# Patient Record
Sex: Male | Born: 1950 | Race: White | Hispanic: No | Marital: Married | Smoking: Current every day smoker
Health system: Southern US, Community
[De-identification: ages and names within clinical notes are randomized; demographics above are authoritative.]

## PROBLEM LIST (undated history)

## (undated) DIAGNOSIS — F17200 Nicotine dependence, unspecified, uncomplicated: Secondary | ICD-10-CM

## (undated) DIAGNOSIS — J449 Chronic obstructive pulmonary disease, unspecified: Secondary | ICD-10-CM

## (undated) DIAGNOSIS — G7119 Other specified myotonic disorders: Secondary | ICD-10-CM

## (undated) DIAGNOSIS — F1020 Alcohol dependence, uncomplicated: Secondary | ICD-10-CM

## (undated) DIAGNOSIS — R296 Repeated falls: Secondary | ICD-10-CM

## (undated) DIAGNOSIS — K219 Gastro-esophageal reflux disease without esophagitis: Secondary | ICD-10-CM

## (undated) DIAGNOSIS — F419 Anxiety disorder, unspecified: Secondary | ICD-10-CM

## (undated) DIAGNOSIS — R569 Unspecified convulsions: Secondary | ICD-10-CM

## (undated) DIAGNOSIS — I10 Essential (primary) hypertension: Secondary | ICD-10-CM

## (undated) DIAGNOSIS — J189 Pneumonia, unspecified organism: Secondary | ICD-10-CM

## (undated) DIAGNOSIS — I739 Peripheral vascular disease, unspecified: Secondary | ICD-10-CM

## (undated) DIAGNOSIS — F1320 Sedative, hypnotic or anxiolytic dependence, uncomplicated: Secondary | ICD-10-CM

## (undated) HISTORY — PX: COLONOSCOPY: SHX174

## (undated) HISTORY — PX: ESOPHAGOGASTRODUODENOSCOPY ENDOSCOPY: SHX5814

## (undated) HISTORY — PX: FRACTURE SURGERY: SHX138

## (undated) HISTORY — PX: VASCULAR SURGERY: SHX849

## (undated) HISTORY — PX: TRACHEOSTOMY: SUR1362

---

## 1979-06-12 HISTORY — PX: FINGER FRACTURE SURGERY: SHX638

## 2012-08-07 ENCOUNTER — Encounter (HOSPITAL_COMMUNITY): Payer: Self-pay

## 2012-08-11 NOTE — Patient Instructions (Addendum)
[address]  08/11/2012   Your procedure is scheduled on:  08/21/12  Report to [facility] at 0800 AM.  Call this number if you have problems the morning of surgery: [phone]   Remember:   Do not eat food:After Midnight.  May have clear liquids:until Midnight .  Clear liquids include soda, tea, black coffee, apple or grape juice, broth.  Take these medicines the morning of surgery with A SIP OF WATER: toprol, dilantin, neurontin, ativan, klonopin   Do not wear jewelry, make-up or nail polish.  Do not wear lotions, powders, or perfumes. You may wear deodorant.  Do not shave 48 hours prior to surgery. Men may shave face and neck.  Do not bring valuables to the hospital.  Contacts, dentures or bridgework may not be worn into surgery.  Leave suitcase in the car. After surgery it may be brought to your room.  For patients admitted to the hospital, checkout time is 11:00 AM the day of discharge.   Patients discharged the day of surgery will not be allowed to drive home.  Name and phone number of your driver: family  Special Instructions: N/A   Please read over the following fact sheets that you were given: Anesthesia Post-op Instructions and Care and Recovery After Surgery   PATIENT INSTRUCTIONS POST-ANESTHESIA  IMMEDIATELY FOLLOWING SURGERY:  Do not drive or operate machinery for the first twenty four hours after surgery.  Do not make any important decisions for twenty four hours after surgery or while taking narcotic pain medications or sedatives.  If you develop intractable nausea and vomiting or a severe headache please notify your doctor immediately.  FOLLOW-UP:  Please make an appointment with your surgeon as instructed. You do not need to follow up with anesthesia unless specifically instructed to do so.  WOUND CARE INSTRUCTIONS (if applicable):  Keep a dry clean dressing on the anesthesia/puncture wound site if there is drainage.  Once the wound has quit draining you may  leave it open to air.  Generally you should leave the bandage intact for twenty four hours unless there is drainage.  If the epidural site drains for more than 36-48 hours please call the anesthesia department.  QUESTIONS?:  Please feel free to call your physician or the hospital operator if you have any questions, and they will be happy to assist you.      Cataract Surgery  A cataract is a clouding of the lens of the eye. When a lens becomes cloudy, vision is reduced based on the degree and nature of the clouding. Surgery may be needed to improve vision. Surgery removes the cloudy lens and usually replaces it with a substitute lens (intraocular lens, IOL). LET YOUR EYE DOCTOR KNOW ABOUT:  Allergies to food or medicine.  Medicines taken including herbs, eyedrops, over-the-counter medicines, and creams.  Use of steroids (by mouth or creams).  Previous problems with anesthetics or numbing medicine.  History of bleeding problems or blood clots.  Previous surgery.  Other health problems, including diabetes and kidney problems.  Possibility of pregnancy, if this applies. RISKS AND COMPLICATIONS  Infection.  Inflammation of the eyeball (endophthalmitis) that can spread to both eyes (sympathetic ophthalmia).  Poor wound healing.  If an IOL is inserted, it can later fall out of proper position. This is very uncommon.  Clouding of the part of your eye that holds an IOL in place. This is called an "after-cataract." These are uncommon, but easily treated. BEFORE THE PROCEDURE  Do not  eat or drink anything except small amounts of water for 8 to 12 before your surgery, or as directed by your caregiver.  Unless you are told otherwise, continue any eyedrops you have been prescribed.  Talk to your primary caregiver about all other medicines that you take (both prescription and non-prescription). In some cases, you may need to stop or change medicines near the time of your surgery. This is  most important if you are taking blood-thinning medicine.Do not stop medicines unless you are told to do so.  Arrange for someone to drive you to and from the procedure.  Do not put contact lenses in either eye on the day of your surgery. PROCEDURE There is more than one method for safely removing a cataract. Your doctor can explain the differences and help determine which is best for you. Phacoemulsification surgery is the most common form of cataract surgery.  An injection is given behind the eye or eyedrops are given to make this a painless procedure.  A small cut (incision) is made on the edge of the clear, dome-shaped surface that covers the front of the eye (cornea).  A tiny probe is painlessly inserted into the eye. This device gives off ultrasound waves that soften and break up the cloudy center of the lens. This makes it easier for the cloudy lens to be removed by suction.  An IOL may be implanted.  The normal lens of the eye is covered by a clear capsule. Part of that capsule is intentionally left in the eye to support the IOL.  Your surgeon may or may not use stitches to close the incision. There are other forms of cataract surgery that require a larger incision and stiches to close the eye. This approach is taken in cases where the doctor feels that the cataract cannot be easily removed using phacoemulsification. AFTER THE PROCEDURE  When an IOL is implanted, it does not need care. It becomes a permanent part of your eye and cannot be seen or felt.  Your doctor will schedule follow-up exams to check on your progress.  Review your other medicines with your doctor to see which can be resumed after surgery.  Use eyedrops or take medicine as prescribed by your doctor. Document Released: 09/16/2011 Document Revised: 12/20/2011 Document Reviewed: 09/16/2011 [facility] Patient Information 2013 [place], [place].

## 2012-08-14 ENCOUNTER — Encounter (HOSPITAL_COMMUNITY)
Admission: RE | Admit: 2012-08-14 | Discharge: 2012-08-14 | Disposition: A | Discharge status: Home / Self Care | Payer: Medicare Other | Source: Ambulatory Visit | Attending: Ophthalmology | Admitting: Ophthalmology

## 2012-08-14 ENCOUNTER — Encounter (HOSPITAL_COMMUNITY): Payer: Self-pay

## 2012-08-14 HISTORY — DX: Essential (primary) hypertension: I10

## 2012-08-14 HISTORY — DX: Peripheral vascular disease, unspecified: I73.9

## 2012-08-14 HISTORY — DX: Other specified myotonic disorders: G71.19

## 2012-08-14 HISTORY — DX: Unspecified convulsions: R56.9

## 2012-08-14 HISTORY — DX: Chronic obstructive pulmonary disease, unspecified: J44.9

## 2012-08-14 HISTORY — DX: Gastro-esophageal reflux disease without esophagitis: K21.9

## 2012-08-14 LAB — BASIC METABOLIC PANEL
BUN: 12 mg/dL (ref 6–23)
Chloride: 94 mEq/L — ABNORMAL LOW (ref 96–112)
Creatinine, Ser: 0.66 mg/dL (ref 0.50–1.35)
GFR calc Af Amer: >90 mL/min (ref >90)
GFR calc non Af Amer: >90 mL/min (ref >90)

## 2012-08-14 LAB — HEMOGLOBIN AND HEMATOCRIT, BLOOD: Hemoglobin: 18.5 g/dL — ABNORMAL HIGH (ref 13.0–17.0)

## 2012-08-21 ENCOUNTER — Encounter (HOSPITAL_COMMUNITY)
Admission: RE | Disposition: A | Discharge status: Home / Self Care | Payer: Self-pay | Source: Ambulatory Visit | Attending: Ophthalmology

## 2012-08-21 ENCOUNTER — Ambulatory Visit (HOSPITAL_COMMUNITY)
Admission: RE | Admit: 2012-08-21 | Discharge: 2012-08-21 | Disposition: A | Discharge status: Home / Self Care | Payer: Medicare Other | Source: Ambulatory Visit | Attending: Ophthalmology | Admitting: Ophthalmology

## 2012-08-21 ENCOUNTER — Encounter (HOSPITAL_COMMUNITY): Payer: Self-pay | Admitting: *Deleted

## 2012-08-21 ENCOUNTER — Encounter (HOSPITAL_COMMUNITY): Payer: Self-pay | Admitting: Anesthesiology

## 2012-08-21 ENCOUNTER — Encounter (HOSPITAL_COMMUNITY): Payer: Self-pay | Admitting: Ophthalmology

## 2012-08-21 ENCOUNTER — Ambulatory Visit (HOSPITAL_COMMUNITY): Payer: Medicare Other | Admitting: Anesthesiology

## 2012-08-21 DIAGNOSIS — J449 Chronic obstructive pulmonary disease, unspecified: Secondary | ICD-10-CM | POA: Insufficient documentation

## 2012-08-21 DIAGNOSIS — J4489 Other specified chronic obstructive pulmonary disease: Secondary | ICD-10-CM | POA: Insufficient documentation

## 2012-08-21 DIAGNOSIS — Z01810 Encounter for preprocedural cardiovascular examination: Secondary | ICD-10-CM | POA: Insufficient documentation

## 2012-08-21 DIAGNOSIS — I10 Essential (primary) hypertension: Secondary | ICD-10-CM | POA: Insufficient documentation

## 2012-08-21 DIAGNOSIS — H2510 Age-related nuclear cataract, unspecified eye: Secondary | ICD-10-CM | POA: Insufficient documentation

## 2012-08-21 DIAGNOSIS — Z01812 Encounter for preprocedural laboratory examination: Secondary | ICD-10-CM | POA: Insufficient documentation

## 2012-08-21 HISTORY — PX: CATARACT EXTRACTION W/PHACO: SHX586

## 2012-08-21 SURGERY — PHACOEMULSIFICATION, CATARACT, WITH IOL INSERTION
Anesthesia: Monitor Anesthesia Care | Site: Eye | Laterality: Right | Wound class: Clean

## 2012-08-21 MED ORDER — EPINEPHRINE HCL 1 MG/ML IJ SOLN
INTRAOCULAR | Status: DC | PRN
  Administered 2012-08-21: 10:00:00

## 2012-08-21 MED ORDER — PHENYLEPHRINE HCL 2.5 % OP SOLN
1.0000 [drp] | OPHTHALMIC | Status: AC
  Administered 2012-08-21 (×3): 1 [drp] via OPHTHALMIC

## 2012-08-21 MED ORDER — LIDOCAINE HCL (PF) 1 % IJ SOLN
INTRAOCULAR | Status: DC | PRN
  Administered 2012-08-21: 10:00:00 via OPHTHALMIC

## 2012-08-21 MED ORDER — LIDOCAINE HCL 3.5 % OP GEL
1.0000 "application " | Freq: Once | OPHTHALMIC | Status: AC
  Administered 2012-08-21: 1 via OPHTHALMIC

## 2012-08-21 MED ORDER — LIDOCAINE 3.5 % OP GEL OPTIME - NO CHARGE
OPHTHALMIC | Status: DC | PRN
  Administered 2012-08-21: 2 [drp] via OPHTHALMIC

## 2012-08-21 MED ORDER — PROVISC 10 MG/ML IO SOLN
INTRAOCULAR | Status: DC | PRN
  Administered 2012-08-21: 8.5 mg via INTRAOCULAR

## 2012-08-21 MED ORDER — NEOMYCIN-POLYMYXIN-DEXAMETH 0.1 % OP OINT
TOPICAL_OINTMENT | OPHTHALMIC | Status: DC | PRN
  Administered 2012-08-21: 1 via OPHTHALMIC

## 2012-08-21 MED ORDER — TETRACAINE HCL 0.5 % OP SOLN
1.0000 [drp] | OPHTHALMIC | Status: AC
  Administered 2012-08-21 (×3): 1 [drp] via OPHTHALMIC

## 2012-08-21 MED ORDER — TETRACAINE HCL 0.5 % OP SOLN
OPHTHALMIC | Status: AC
  Filled 2012-08-21: qty 2

## 2012-08-21 MED ORDER — CYCLOPENTOLATE-PHENYLEPHRINE 0.2-1 % OP SOLN
1.0000 [drp] | OPHTHALMIC | Status: AC
Start: 2012-08-21 — End: 2012-08-21
  Administered 2012-08-21 (×3): 1 [drp] via OPHTHALMIC

## 2012-08-21 MED ORDER — EPINEPHRINE HCL 1 MG/ML IJ SOLN
INTRAMUSCULAR | Status: AC
  Filled 2012-08-21: qty 1

## 2012-08-21 MED ORDER — BSS IO SOLN
INTRAOCULAR | Status: DC | PRN
  Administered 2012-08-21: 15 mL via INTRAOCULAR

## 2012-08-21 MED ORDER — LACTATED RINGERS IV SOLN
INTRAVENOUS | Status: DC
  Administered 2012-08-21: 1000 mL via INTRAVENOUS

## 2012-08-21 MED ORDER — CYCLOPENTOLATE-PHENYLEPHRINE 0.2-1 % OP SOLN
OPHTHALMIC | Status: AC
  Filled 2012-08-21: qty 2

## 2012-08-21 MED ORDER — PHENYLEPHRINE HCL 2.5 % OP SOLN
OPHTHALMIC | Status: AC
  Filled 2012-08-21: qty 2

## 2012-08-21 MED ORDER — MIDAZOLAM HCL 2 MG/2ML IJ SOLN
1.0000 mg | INTRAMUSCULAR | Status: DC | PRN
  Administered 2012-08-21 (×2): 1 mg via INTRAVENOUS

## 2012-08-21 MED ORDER — MIDAZOLAM HCL 2 MG/2ML IJ SOLN
INTRAMUSCULAR | Status: AC
  Filled 2012-08-21: qty 2

## 2012-08-21 MED ORDER — LIDOCAINE HCL 3.5 % OP GEL
OPHTHALMIC | Status: AC
  Filled 2012-08-21: qty 5

## 2012-08-21 MED ORDER — NEOMYCIN-POLYMYXIN-DEXAMETH 3.5-10000-0.1 OP OINT
TOPICAL_OINTMENT | OPHTHALMIC | Status: AC
  Filled 2012-08-21: qty 3.5

## 2012-08-21 MED ORDER — POVIDONE-IODINE 5 % OP SOLN
OPHTHALMIC | Status: DC | PRN
  Administered 2012-08-21: 1 via OPHTHALMIC

## 2012-08-21 MED ORDER — LIDOCAINE HCL (PF) 1 % IJ SOLN
INTRAMUSCULAR | Status: AC
  Filled 2012-08-21: qty 2

## 2012-08-21 SURGICAL SUPPLY — 32 items

## 2012-08-21 NOTE — Anesthesia Postprocedure Evaluation (Signed)
  Anesthesia Post-op Note  Patient: Kenneth Bass  Procedure(s) Performed: Procedure(s) (LRB) with comments: CATARACT EXTRACTION PHACO AND INTRAOCULAR LENS PLACEMENT (IOC) (Right) - CDE=12.47  Patient Location: PACU and Short Stay  Anesthesia Type:MAC  Level of Consciousness: awake, alert  and oriented  Airway and Oxygen Therapy: Patient Spontanous Breathing  Post-op Pain: none  Post-op Assessment: Post-op Vital signs reviewed, Patient's Cardiovascular Status Stable, Respiratory Function Stable, Patent Airway and No signs of Nausea or vomiting  Post-op Vital Signs: Reviewed  Complications: No apparent anesthesia complications

## 2012-08-21 NOTE — Op Note (Signed)
NAME:  Kenneth Bass, Kenneth Bass NO.:  [id number]  MEDICAL RECORD NO.:  [id number]  LOCATION:  [facility]                          FACILITY:  [facility]  PHYSICIAN:  Susanne Greenhouse, MD       DATE OF BIRTH:  1950/11/27  DATE OF PROCEDURE:  08/21/2012 DATE OF DISCHARGE:  08/21/2012                              OPERATIVE REPORT   PREOPERATIVE DIAGNOSIS:  Nuclear cataract, right eye, diagnosis code 366.16.  POSTOPERATIVE DIAGNOSIS:  Nuclear cataract, right eye, diagnosis code 366.16.  SURGEON:  Susanne Greenhouse, MD  OPERATION PERFORMED:  Phacoemulsification with posterior chamber intraocular lens implantation, right eye.  ANESTHESIA:  Topical with monitored anesthesia care and IV sedation.  OPERATIVE SUMMARY:  In the preoperative area, dilating drops were placed into the right eye.  The patient was then brought into the operating room where he was placed under topical anesthesia and IV sedation.  The eye was then prepped and draped.  Beginning with a 75 blade, a paracentesis port was made at the surgeon's 2 o'clock position.  The anterior chamber was then filled with a 1% nonpreserved lidocaine solution with epinephrine.  This was followed by Viscoat to deepen the chamber.  A small fornix-based peritomy was performed superiorly.  Next, a single iris hook was placed through the limbus superiorly.  A 2.4-mm keratome blade was then used to make a clear corneal incision over the iris hook.  A bent cystotome needle and Utrata forceps were used to create a continuous tear capsulotomy.  Hydrodissection was performed using balanced salt solution on a fine cannula.  The lens nucleus was then removed using phacoemulsification in a quadrant cracking technique. The cortical material was then removed with irrigation and aspiration. The capsular bag and anterior chamber were refilled with Provisc.  The wound was widened to approximately 3 mm and a posterior chamber intraocular lens was placed into the  capsular bag without difficulty using an [employer] lens injecting system.  A single 10-0 nylon suture was then used to close the incision as well as stromal hydration. The Provisc was removed from the anterior chamber and capsular bag with irrigation and aspiration.  At this point, the wounds were tested for leak, which were negative.  The anterior chamber remained deep and stable.  The patient tolerated the procedure well.  There were no operative complications, and he awoke from topical anesthesia and IV sedation without problem.  No surgical specimens.  Prosthetic device used is a [employer] Lomb posterior chamber lens, model enVista, model number MX60, power of 15.5, serial number is [id number].          ______________________________ Susanne Greenhouse, MD     KEH/MEDQ  D:  08/21/2012  T:  08/21/2012  Job:  [id number]

## 2012-08-21 NOTE — H&P (Signed)
I have reviewed the H&P, the patient was re-examined, and I have identified no interval changes in medical condition and plan of care since the history and physical of record  

## 2012-08-21 NOTE — Transfer of Care (Signed)
Immediate Anesthesia Transfer of Care Note  Patient: Kenneth Bass  Procedure(s) Performed: Procedure(s) (LRB) with comments: CATARACT EXTRACTION PHACO AND INTRAOCULAR LENS PLACEMENT (IOC) (Right) - CDE=12.47  Patient Location: PACU and Short Stay  Anesthesia Type:MAC  Level of Consciousness: awake  Airway & Oxygen Therapy: Patient Spontanous Breathing  Post-op Assessment: Report given to PACU RN  Post vital signs: Reviewed  Complications: No apparent anesthesia complications

## 2012-08-21 NOTE — Anesthesia Preprocedure Evaluation (Addendum)
Anesthesia Evaluation  Patient identified by MRN, date of birth, ID band Patient awake    Reviewed: Allergy & Precautions, H&P , NPO status , Patient's Chart, lab work & pertinent test results, reviewed documented beta blocker date and time   Airway Mallampati: II  Neck ROM: Full    Dental  (+) Poor Dentition, Missing, Chipped, Loose and Dental Advisory Given   Pulmonary COPDCurrent Smoker,  + rhonchi         Cardiovascular hypertension, Pt. on medications Rhythm:Regular Rate:Normal     Neuro/Psych Seizures -, Well Controlled,  Anxiety  Neuromuscular disease    GI/Hepatic GERD-  ,(+)     substance abuse  alcohol use,   Endo/Other    Renal/GU      Musculoskeletal   Abdominal   Peds  Hematology   Anesthesia Other Findings   Reproductive/Obstetrics                           Anesthesia Physical Anesthesia Plan  ASA: III  Anesthesia Plan: MAC   Post-op Pain Management:    Induction: Intravenous  Airway Management Planned: Nasal Cannula  Additional Equipment:   Intra-op Plan:   Post-operative Plan:   Informed Consent: I have reviewed the patients History and Physical, chart, labs and discussed the procedure including the risks, benefits and alternatives for the proposed anesthesia with the patient or authorized representative who has indicated his/her understanding and acceptance.     Plan Discussed with:   Anesthesia Plan Comments:         Anesthesia Quick Evaluation  

## 2012-08-21 NOTE — Brief Op Note (Signed)
Pre-Op Dx: Cataract OD Post-Op Dx: Cataract OD Surgeon: Dayanira Giovannetti Anesthesia: Topical with MAC Surgery: Cataract Extraction with Intraocular lens Implant OD Implant: B&L enVista Specimen: None Complications: None 

## 2012-08-23 ENCOUNTER — Encounter (HOSPITAL_COMMUNITY): Payer: Self-pay | Admitting: Ophthalmology

## 2012-09-12 ENCOUNTER — Encounter (HOSPITAL_COMMUNITY): Payer: Self-pay | Admitting: Pharmacy Technician

## 2012-09-13 MED ORDER — FENTANYL CITRATE 0.05 MG/ML IJ SOLN: 25.0000 ug | INTRAMUSCULAR | Status: DC | PRN

## 2012-09-13 MED ORDER — ONDANSETRON HCL 4 MG/2ML IJ SOLN: 4.0000 mg | Freq: Once | INTRAMUSCULAR | Status: AC | PRN

## 2012-09-14 ENCOUNTER — Encounter (HOSPITAL_COMMUNITY): Payer: Self-pay

## 2012-09-14 ENCOUNTER — Encounter (HOSPITAL_COMMUNITY)
Admission: RE | Admit: 2012-09-14 | Discharge: 2012-09-14 | Payer: Medicare Other | Source: Ambulatory Visit | Admitting: Ophthalmology

## 2012-09-15 MED ORDER — LIDOCAINE HCL (PF) 1 % IJ SOLN
INTRAMUSCULAR | Status: AC
  Filled 2012-09-15: qty 2

## 2012-09-15 MED ORDER — CYCLOPENTOLATE-PHENYLEPHRINE 0.2-1 % OP SOLN
OPHTHALMIC | Status: AC
  Filled 2012-09-15: qty 2

## 2012-09-15 MED ORDER — TETRACAINE HCL 0.5 % OP SOLN
OPHTHALMIC | Status: AC
  Filled 2012-09-15: qty 2

## 2012-09-15 MED ORDER — NEOMYCIN-POLYMYXIN-DEXAMETH 3.5-10000-0.1 OP OINT
TOPICAL_OINTMENT | OPHTHALMIC | Status: AC
  Filled 2012-09-15: qty 3.5

## 2012-09-15 MED ORDER — PHENYLEPHRINE HCL 2.5 % OP SOLN
OPHTHALMIC | Status: AC
  Filled 2012-09-15: qty 2

## 2012-09-15 MED ORDER — LIDOCAINE HCL 3.5 % OP GEL
OPHTHALMIC | Status: AC
  Filled 2012-09-15: qty 5

## 2012-09-18 ENCOUNTER — Encounter (HOSPITAL_COMMUNITY)
Admission: RE | Disposition: A | Discharge status: Home / Self Care | Payer: Self-pay | Source: Ambulatory Visit | Attending: Ophthalmology

## 2012-09-18 ENCOUNTER — Ambulatory Visit (HOSPITAL_COMMUNITY)
Admission: RE | Admit: 2012-09-18 | Discharge: 2012-09-18 | Disposition: A | Discharge status: Home / Self Care | Payer: Medicare Other | Source: Ambulatory Visit | Attending: Ophthalmology | Admitting: Ophthalmology

## 2012-09-18 ENCOUNTER — Ambulatory Visit (HOSPITAL_COMMUNITY): Payer: Medicare Other | Admitting: Anesthesiology

## 2012-09-18 ENCOUNTER — Encounter (HOSPITAL_COMMUNITY): Payer: Self-pay | Admitting: Anesthesiology

## 2012-09-18 ENCOUNTER — Encounter (HOSPITAL_COMMUNITY): Payer: Self-pay | Admitting: *Deleted

## 2012-09-18 DIAGNOSIS — J449 Chronic obstructive pulmonary disease, unspecified: Secondary | ICD-10-CM | POA: Insufficient documentation

## 2012-09-18 DIAGNOSIS — J4489 Other specified chronic obstructive pulmonary disease: Secondary | ICD-10-CM | POA: Insufficient documentation

## 2012-09-18 DIAGNOSIS — H2510 Age-related nuclear cataract, unspecified eye: Secondary | ICD-10-CM | POA: Insufficient documentation

## 2012-09-18 DIAGNOSIS — I10 Essential (primary) hypertension: Secondary | ICD-10-CM | POA: Insufficient documentation

## 2012-09-18 DIAGNOSIS — F17200 Nicotine dependence, unspecified, uncomplicated: Secondary | ICD-10-CM | POA: Insufficient documentation

## 2012-09-18 HISTORY — PX: CATARACT EXTRACTION W/PHACO: SHX586

## 2012-09-18 SURGERY — PHACOEMULSIFICATION, CATARACT, WITH IOL INSERTION
Anesthesia: Monitor Anesthesia Care | Site: Eye | Laterality: Left | Wound class: Clean

## 2012-09-18 MED ORDER — LIDOCAINE 3.5 % OP GEL OPTIME - NO CHARGE
OPHTHALMIC | Status: DC | PRN
  Administered 2012-09-18: 2 [drp] via OPHTHALMIC

## 2012-09-18 MED ORDER — EPINEPHRINE HCL 1 MG/ML IJ SOLN
INTRAOCULAR | Status: DC | PRN
  Administered 2012-09-18: 11:00:00

## 2012-09-18 MED ORDER — LACTATED RINGERS IV SOLN
INTRAVENOUS | Status: DC | PRN
  Administered 2012-09-18: 09:00:00 via INTRAVENOUS

## 2012-09-18 MED ORDER — EPINEPHRINE HCL 1 MG/ML IJ SOLN
INTRAMUSCULAR | Status: AC
  Filled 2012-09-18: qty 1

## 2012-09-18 MED ORDER — MIDAZOLAM HCL 2 MG/2ML IJ SOLN
INTRAMUSCULAR | Status: AC
  Filled 2012-09-18: qty 2

## 2012-09-18 MED ORDER — POVIDONE-IODINE 5 % OP SOLN
OPHTHALMIC | Status: DC | PRN
  Administered 2012-09-18: 1 via OPHTHALMIC

## 2012-09-18 MED ORDER — BSS IO SOLN
INTRAOCULAR | Status: DC | PRN
  Administered 2012-09-18: 15 mL via INTRAOCULAR

## 2012-09-18 MED ORDER — LIDOCAINE HCL (PF) 1 % IJ SOLN
INTRAOCULAR | Status: DC | PRN
  Administered 2012-09-18: 11:00:00 via OPHTHALMIC

## 2012-09-18 MED ORDER — NEOMYCIN-POLYMYXIN-DEXAMETH 0.1 % OP OINT
TOPICAL_OINTMENT | OPHTHALMIC | Status: DC | PRN
  Administered 2012-09-18: 1 via OPHTHALMIC

## 2012-09-18 MED ORDER — MIDAZOLAM HCL 2 MG/2ML IJ SOLN
1.0000 mg | INTRAMUSCULAR | Status: DC | PRN
  Administered 2012-09-18: 2 mg via INTRAVENOUS

## 2012-09-18 MED ORDER — LIDOCAINE HCL 3.5 % OP GEL
1.0000 "application " | Freq: Once | OPHTHALMIC | Status: AC
  Administered 2012-09-18: 1 via OPHTHALMIC

## 2012-09-18 MED ORDER — TETRACAINE HCL 0.5 % OP SOLN
1.0000 [drp] | OPHTHALMIC | Status: AC
  Administered 2012-09-18 (×3): 1 [drp] via OPHTHALMIC

## 2012-09-18 MED ORDER — PHENYLEPHRINE HCL 2.5 % OP SOLN
1.0000 [drp] | OPHTHALMIC | Status: AC
  Administered 2012-09-18 (×3): 1 [drp] via OPHTHALMIC

## 2012-09-18 MED ORDER — PROVISC 10 MG/ML IO SOLN
INTRAOCULAR | Status: DC | PRN
  Administered 2012-09-18: 8.5 mg via INTRAOCULAR

## 2012-09-18 MED ORDER — LACTATED RINGERS IV SOLN
INTRAVENOUS | Status: DC
  Administered 2012-09-18: 10:00:00 via INTRAVENOUS

## 2012-09-18 MED ORDER — CYCLOPENTOLATE-PHENYLEPHRINE 0.2-1 % OP SOLN
1.0000 [drp] | OPHTHALMIC | Status: AC
  Administered 2012-09-18 (×3): 1 [drp] via OPHTHALMIC

## 2012-09-18 SURGICAL SUPPLY — 32 items
CAPSULAR TENSION RING-AMO (OPHTHALMIC RELATED) IMPLANT
CLOTH BEACON ORANGE TIMEOUT ST (SAFETY) ×2 IMPLANT
EYE SHIELD UNIVERSAL CLEAR (GAUZE/BANDAGES/DRESSINGS) ×2 IMPLANT
GLOVE BIO SURGEON STRL SZ 6.5 (GLOVE) IMPLANT
GLOVE BIOGEL PI IND STRL 6.5 (GLOVE) ×1 IMPLANT
GLOVE BIOGEL PI IND STRL 7.0 (GLOVE) IMPLANT
GLOVE BIOGEL PI IND STRL 7.5 (GLOVE) ×1 IMPLANT
GLOVE BIOGEL PI INDICATOR 6.5 (GLOVE) ×1
GLOVE BIOGEL PI INDICATOR 7.0 (GLOVE)
GLOVE BIOGEL PI INDICATOR 7.5 (GLOVE) ×1
GLOVE ECLIPSE 6.5 STRL STRAW (GLOVE) IMPLANT
GLOVE ECLIPSE 7.0 STRL STRAW (GLOVE) IMPLANT
GLOVE ECLIPSE 7.5 STRL STRAW (GLOVE) IMPLANT
GLOVE EXAM NITRILE LRG STRL (GLOVE) IMPLANT
GLOVE EXAM NITRILE MD LF STRL (GLOVE) ×2 IMPLANT
GLOVE SKINSENSE NS SZ6.5 (GLOVE)
GLOVE SKINSENSE NS SZ7.0 (GLOVE)
GLOVE SKINSENSE STRL SZ6.5 (GLOVE) IMPLANT
GLOVE SKINSENSE STRL SZ7.0 (GLOVE) IMPLANT
KIT VITRECTOMY (OPHTHALMIC RELATED) IMPLANT
PAD ARMBOARD 7.5X6 YLW CONV (MISCELLANEOUS) ×2 IMPLANT
PROC W NO LENS (INTRAOCULAR LENS)
PROC W SPEC LENS (INTRAOCULAR LENS)
PROCESS W NO LENS (INTRAOCULAR LENS) IMPLANT
PROCESS W SPEC LENS (INTRAOCULAR LENS) IMPLANT
RING MALYGIN (MISCELLANEOUS) IMPLANT
SIGHTPATH CAT PROC W REG LENS (Ophthalmic Related) ×2 IMPLANT
SYR TB 1ML LL NO SAFETY (SYRINGE) ×2 IMPLANT
TAPE SURG TRANSPORE 1 IN (GAUZE/BANDAGES/DRESSINGS) ×1 IMPLANT
TAPE SURGICAL TRANSPORE 1 IN (GAUZE/BANDAGES/DRESSINGS) ×1
VISCOELASTIC ADDITIONAL (OPHTHALMIC RELATED) IMPLANT
WATER STERILE IRR 250ML POUR (IV SOLUTION) ×2 IMPLANT

## 2012-09-18 NOTE — Anesthesia Postprocedure Evaluation (Signed)
  Anesthesia Post-op Note  Patient: Kenneth Bass  Procedure(s) Performed: Procedure(s) (LRB) with comments: CATARACT EXTRACTION PHACO AND INTRAOCULAR LENS PLACEMENT (IOC) (Left) - CDE 10.77  Patient Location: PACU  Anesthesia Type: MAC  Level of Consciousness: awake, alert , oriented and patient cooperative  Airway and Oxygen Therapy: Patient Spontanous Breathing room air  Post-op Pain: mild  Post-op Assessment: Post-op Vital signs reviewed, Patient's Cardiovascular Status Stable, Respiratory Function Stable, Patent Airway and No signs of Nausea or vomiting  Post-op Vital Signs: Reviewed and stable  Complications: No apparent anesthesia complications

## 2012-09-18 NOTE — Preoperative (Signed)
Beta Blockers   Reason not to administer Beta Blockers:Not Applicable 

## 2012-09-18 NOTE — H&P (Signed)
I have reviewed the H&P, the patient was re-examined, and I have identified no interval changes in medical condition and plan of care since the history and physical of record  

## 2012-09-18 NOTE — Brief Op Note (Signed)
Pre-Op Dx: Cataract OS Post-Op Dx: Cataract OS Surgeon: Jonpaul Lumm Anesthesia: Topical with MAC Surgery: Cataract Extraction with Intraocular lens Implant OS Implant: B&L enVista Specimen: None Complications: None 

## 2012-09-18 NOTE — Anesthesia Preprocedure Evaluation (Signed)
Anesthesia Evaluation  Patient identified by MRN, date of birth, ID band Patient awake    Reviewed: Allergy & Precautions, H&P , NPO status , Patient's Chart, lab work & pertinent test results, reviewed documented beta blocker date and time   Airway Mallampati: II  Neck ROM: Full    Dental  (+) Poor Dentition, Missing, Chipped, Loose and Dental Advisory Given   Pulmonary COPDCurrent Smoker,  + rhonchi         Cardiovascular hypertension, Pt. on medications Rhythm:Regular Rate:Normal     Neuro/Psych Seizures -, Well Controlled,  Anxiety  Neuromuscular disease    GI/Hepatic GERD-  ,(+)     substance abuse  alcohol use,   Endo/Other    Renal/GU      Musculoskeletal   Abdominal   Peds  Hematology   Anesthesia Other Findings   Reproductive/Obstetrics                           Anesthesia Physical Anesthesia Plan  ASA: III  Anesthesia Plan: MAC   Post-op Pain Management:    Induction: Intravenous  Airway Management Planned: Nasal Cannula  Additional Equipment:   Intra-op Plan:   Post-operative Plan:   Informed Consent: I have reviewed the patients History and Physical, chart, labs and discussed the procedure including the risks, benefits and alternatives for the proposed anesthesia with the patient or authorized representative who has indicated his/her understanding and acceptance.     Plan Discussed with:   Anesthesia Plan Comments:         Anesthesia Quick Evaluation

## 2012-09-18 NOTE — Transfer of Care (Signed)
  Anesthesia Post-op Note  Patient: Kenneth Bass  Procedure(s) Performed: Procedure(s) (LRB) with comments: CATARACT EXTRACTION PHACO AND INTRAOCULAR LENS PLACEMENT (IOC) (Left) - CDE 10.77  Patient Location: PACU  Anesthesia Type: MAC  Level of Consciousness: awake, alert , oriented and patient cooperative  Airway and Oxygen Therapy: Patient Spontanous Breathing room air  Post-op Pain: mild  Post-op Assessment: Post-op Vital signs reviewed, Patient's Cardiovascular Status Stable, Respiratory Function Stable, Patent Airway and No signs of Nausea or vomiting  Post-op Vital Signs: Reviewed and stable  Complications: No apparent anesthesia complications  

## 2012-09-18 NOTE — Anesthesia Procedure Notes (Signed)
Procedure Name: MAC Performed by: ANDRAZA, Jeyli Zwicker L Pre-anesthesia Checklist: Patient identified, Patient being monitored, Emergency Drugs available, Timeout performed and Suction available Patient Re-evaluated:Patient Re-evaluated prior to inductionOxygen Delivery Method: Nasal cannula     

## 2012-09-18 NOTE — Op Note (Signed)
NAME:  Kenneth Bass, Kenneth Bass NO.:  [id number]  MEDICAL RECORD NO.:  [id number]  LOCATION:  [facility]                          FACILITY:  [facility]  PHYSICIAN:  Susanne Greenhouse, MD       DATE OF BIRTH:  [birth year]  DATE OF PROCEDURE:  09/18/2012 DATE OF DISCHARGE:  09/18/2012                              OPERATIVE REPORT   PREOPERATIVE DIAGNOSIS:  Nuclear cataract, left eye.  Diagnosis code 366.16.  POSTOPERATIVE DIAGNOSIS:  Nuclear cataract, left eye.  Diagnosis code 366.16.  OPERATION PERFORMED:  Phacoemulsification with posterior chamber intraocular lens implantation, left eye.  SURGEON:  Susanne Greenhouse, MD  ANESTHESIA:  Topical with monitored anesthesia care and IV sedation.  OPERATIVE SUMMARY:  In the preoperative area, dilating drops were placed into the left eye.  The patient was then brought into the operating room where he was placed under topical anesthesia and IV sedation.  The eye was then prepped and draped.  Beginning with a 75 blade, a paracentesis port was made at the surgeon's 2 o'clock position.  The anterior chamber was then filled with a 1% nonpreserved lidocaine solution with epinephrine.  This was followed by Viscoat to deepen the chamber.  A small fornix-based peritomy was performed superiorly.  Next, a single iris hook was placed through the limbus superiorly.  A 2.4-mm keratome blade was then used to make a clear corneal incision over the iris hook. A bent cystotome needle and Utrata forceps were used to create a continuous tear capsulotomy.  Hydrodissection was performed using balanced salt solution on a fine cannula.  The lens nucleus was then removed using phacoemulsification in a quadrant cracking technique.  The cortical material was then removed with irrigation and aspiration.  The capsular bag and anterior chamber were refilled with Provisc.  The wound was widened to approximately 3 mm and a posterior chamber intraocular lens was placed into the  capsular bag without difficulty using an [employer] lens injecting system.  A single 10-0 nylon suture was then used to close the incision as well as stromal hydration.  The Provisc was removed from the anterior chamber and capsular bag with irrigation and aspiration.  At this point, the wounds were tested for leak, which were negative.  The anterior chamber remained deep and stable.  The patient tolerated the procedure well.  There were no operative complications, and he awoke from topical anesthesia and IV sedation without problem.  No surgical specimens.  Prosthetic device used is a Theme park manager, model EnVista, model number MX60, power of 17.0, serial number is [id number].          ______________________________ Susanne Greenhouse, MD     KEH/MEDQ  D:  09/18/2012  T:  09/18/2012  Job:  [id number]

## 2012-09-20 ENCOUNTER — Encounter (HOSPITAL_COMMUNITY): Payer: Self-pay | Admitting: Ophthalmology

## 2014-08-02 ENCOUNTER — Encounter (HOSPITAL_COMMUNITY): Payer: Self-pay | Admitting: Emergency Medicine

## 2014-08-02 ENCOUNTER — Inpatient Hospital Stay (HOSPITAL_COMMUNITY): Payer: Medicare Other

## 2014-08-02 ENCOUNTER — Inpatient Hospital Stay (HOSPITAL_COMMUNITY)
Admission: EM | Admit: 2014-08-02 | Discharge: 2014-09-10 | DRG: 004 | Disposition: E | Payer: Medicare Other | Attending: Pulmonary Disease | Admitting: Pulmonary Disease

## 2014-08-02 ENCOUNTER — Emergency Department (HOSPITAL_COMMUNITY): Payer: Medicare Other

## 2014-08-02 DIAGNOSIS — G312 Degeneration of nervous system due to alcohol: Secondary | ICD-10-CM | POA: Diagnosis present

## 2014-08-02 DIAGNOSIS — I10 Essential (primary) hypertension: Secondary | ICD-10-CM | POA: Diagnosis present

## 2014-08-02 DIAGNOSIS — Z978 Presence of other specified devices: Secondary | ICD-10-CM

## 2014-08-02 DIAGNOSIS — E872 Acidosis: Secondary | ICD-10-CM

## 2014-08-02 DIAGNOSIS — D696 Thrombocytopenia, unspecified: Secondary | ICD-10-CM | POA: Diagnosis present

## 2014-08-02 DIAGNOSIS — J189 Pneumonia, unspecified organism: Secondary | ICD-10-CM | POA: Diagnosis present

## 2014-08-02 DIAGNOSIS — F17210 Nicotine dependence, cigarettes, uncomplicated: Secondary | ICD-10-CM | POA: Diagnosis present

## 2014-08-02 DIAGNOSIS — G40909 Epilepsy, unspecified, not intractable, without status epilepticus: Secondary | ICD-10-CM | POA: Diagnosis present

## 2014-08-02 DIAGNOSIS — R739 Hyperglycemia, unspecified: Secondary | ICD-10-CM | POA: Diagnosis present

## 2014-08-02 DIAGNOSIS — Z7982 Long term (current) use of aspirin: Secondary | ICD-10-CM

## 2014-08-02 DIAGNOSIS — R451 Restlessness and agitation: Secondary | ICD-10-CM | POA: Diagnosis present

## 2014-08-02 DIAGNOSIS — J9801 Acute bronchospasm: Secondary | ICD-10-CM | POA: Diagnosis not present

## 2014-08-02 DIAGNOSIS — Z66 Do not resuscitate: Secondary | ICD-10-CM | POA: Diagnosis not present

## 2014-08-02 DIAGNOSIS — I739 Peripheral vascular disease, unspecified: Secondary | ICD-10-CM | POA: Diagnosis present

## 2014-08-02 DIAGNOSIS — E46 Unspecified protein-calorie malnutrition: Secondary | ICD-10-CM | POA: Diagnosis present

## 2014-08-02 DIAGNOSIS — E874 Mixed disorder of acid-base balance: Secondary | ICD-10-CM | POA: Diagnosis not present

## 2014-08-02 DIAGNOSIS — F419 Anxiety disorder, unspecified: Secondary | ICD-10-CM | POA: Diagnosis present

## 2014-08-02 DIAGNOSIS — J440 Chronic obstructive pulmonary disease with acute lower respiratory infection: Secondary | ICD-10-CM

## 2014-08-02 DIAGNOSIS — G629 Polyneuropathy, unspecified: Secondary | ICD-10-CM

## 2014-08-02 DIAGNOSIS — Z515 Encounter for palliative care: Secondary | ICD-10-CM | POA: Diagnosis not present

## 2014-08-02 DIAGNOSIS — N400 Enlarged prostate without lower urinary tract symptoms: Secondary | ICD-10-CM | POA: Diagnosis present

## 2014-08-02 DIAGNOSIS — J9600 Acute respiratory failure, unspecified whether with hypoxia or hypercapnia: Secondary | ICD-10-CM

## 2014-08-02 DIAGNOSIS — E870 Hyperosmolality and hypernatremia: Secondary | ICD-10-CM | POA: Diagnosis not present

## 2014-08-02 DIAGNOSIS — J9602 Acute respiratory failure with hypercapnia: Secondary | ICD-10-CM | POA: Diagnosis present

## 2014-08-02 DIAGNOSIS — Z79899 Other long term (current) drug therapy: Secondary | ICD-10-CM | POA: Diagnosis not present

## 2014-08-02 DIAGNOSIS — J9621 Acute and chronic respiratory failure with hypoxia: Principal | ICD-10-CM | POA: Diagnosis present

## 2014-08-02 DIAGNOSIS — J9601 Acute respiratory failure with hypoxia: Secondary | ICD-10-CM

## 2014-08-02 DIAGNOSIS — F1320 Sedative, hypnotic or anxiolytic dependence, uncomplicated: Secondary | ICD-10-CM | POA: Diagnosis present

## 2014-08-02 DIAGNOSIS — Z930 Tracheostomy status: Secondary | ICD-10-CM

## 2014-08-02 DIAGNOSIS — J449 Chronic obstructive pulmonary disease, unspecified: Secondary | ICD-10-CM | POA: Diagnosis present

## 2014-08-02 DIAGNOSIS — Z4659 Encounter for fitting and adjustment of other gastrointestinal appliance and device: Secondary | ICD-10-CM

## 2014-08-02 DIAGNOSIS — F1010 Alcohol abuse, uncomplicated: Secondary | ICD-10-CM | POA: Diagnosis present

## 2014-08-02 DIAGNOSIS — E876 Hypokalemia: Secondary | ICD-10-CM | POA: Diagnosis present

## 2014-08-02 DIAGNOSIS — F10231 Alcohol dependence with withdrawal delirium: Secondary | ICD-10-CM | POA: Diagnosis present

## 2014-08-02 DIAGNOSIS — R609 Edema, unspecified: Secondary | ICD-10-CM | POA: Diagnosis present

## 2014-08-02 DIAGNOSIS — E878 Other disorders of electrolyte and fluid balance, not elsewhere classified: Secondary | ICD-10-CM | POA: Diagnosis present

## 2014-08-02 DIAGNOSIS — J9690 Respiratory failure, unspecified, unspecified whether with hypoxia or hypercapnia: Secondary | ICD-10-CM

## 2014-08-02 DIAGNOSIS — F410 Panic disorder [episodic paroxysmal anxiety] without agoraphobia: Secondary | ICD-10-CM | POA: Diagnosis present

## 2014-08-02 DIAGNOSIS — K219 Gastro-esophageal reflux disease without esophagitis: Secondary | ICD-10-CM | POA: Diagnosis present

## 2014-08-02 DIAGNOSIS — Z7951 Long term (current) use of inhaled steroids: Secondary | ICD-10-CM | POA: Diagnosis not present

## 2014-08-02 DIAGNOSIS — F10931 Alcohol use, unspecified with withdrawal delirium: Secondary | ICD-10-CM

## 2014-08-02 DIAGNOSIS — R410 Disorientation, unspecified: Secondary | ICD-10-CM

## 2014-08-02 DIAGNOSIS — Y92009 Unspecified place in unspecified non-institutional (private) residence as the place of occurrence of the external cause: Secondary | ICD-10-CM

## 2014-08-02 DIAGNOSIS — E785 Hyperlipidemia, unspecified: Secondary | ICD-10-CM | POA: Diagnosis present

## 2014-08-02 DIAGNOSIS — R600 Localized edema: Secondary | ICD-10-CM | POA: Diagnosis present

## 2014-08-02 DIAGNOSIS — J441 Chronic obstructive pulmonary disease with (acute) exacerbation: Secondary | ICD-10-CM | POA: Diagnosis present

## 2014-08-02 DIAGNOSIS — W19XXXA Unspecified fall, initial encounter: Secondary | ICD-10-CM

## 2014-08-02 DIAGNOSIS — R296 Repeated falls: Secondary | ICD-10-CM | POA: Diagnosis present

## 2014-08-02 DIAGNOSIS — Z8674 Personal history of sudden cardiac arrest: Secondary | ICD-10-CM

## 2014-08-02 DIAGNOSIS — R9431 Abnormal electrocardiogram [ECG] [EKG]: Secondary | ICD-10-CM

## 2014-08-02 DIAGNOSIS — R0600 Dyspnea, unspecified: Secondary | ICD-10-CM

## 2014-08-02 DIAGNOSIS — J9620 Acute and chronic respiratory failure, unspecified whether with hypoxia or hypercapnia: Secondary | ICD-10-CM

## 2014-08-02 DIAGNOSIS — J95851 Ventilator associated pneumonia: Secondary | ICD-10-CM

## 2014-08-02 DIAGNOSIS — G9340 Encephalopathy, unspecified: Secondary | ICD-10-CM

## 2014-08-02 DIAGNOSIS — Z720 Tobacco use: Secondary | ICD-10-CM | POA: Diagnosis present

## 2014-08-02 HISTORY — DX: Alcohol dependence, uncomplicated: F10.20

## 2014-08-02 HISTORY — DX: Pneumonia, unspecified organism: J18.9

## 2014-08-02 HISTORY — DX: Repeated falls: R29.6

## 2014-08-02 HISTORY — DX: Anxiety disorder, unspecified: F41.9

## 2014-08-02 HISTORY — DX: Sedative, hypnotic or anxiolytic dependence, uncomplicated: F13.20

## 2014-08-02 HISTORY — DX: Nicotine dependence, unspecified, uncomplicated: F17.200

## 2014-08-02 LAB — COMPREHENSIVE METABOLIC PANEL
ALK PHOS: 107 U/L (ref 39–117)
ALT: 6 U/L (ref 0–53)
AST: 23 U/L (ref 0–37)
Albumin: 3.9 g/dL (ref 3.5–5.2)
Anion gap: 15 (ref 5–15)
BILIRUBIN TOTAL: 1 mg/dL (ref 0.3–1.2)
BUN: 15 mg/dL (ref 6–23)
CO2: 26 meq/L (ref 19–32)
CREATININE: 0.69 mg/dL (ref 0.50–1.35)
Calcium: 8 mg/dL — ABNORMAL LOW (ref 8.4–10.5)
Chloride: 99 mEq/L (ref 96–112)
GFR calc Af Amer: >90 mL/min (ref >90)
GFR calc non Af Amer: >90 mL/min (ref >90)
Glucose, Bld: 116 mg/dL — ABNORMAL HIGH (ref 70–99)
Potassium: 4.1 mEq/L (ref 3.7–5.3)
Sodium: 140 mEq/L (ref 137–147)
Total Protein: 7.3 g/dL (ref 6.0–8.3)

## 2014-08-02 LAB — CBC
HCT: 49.7 % (ref 39.0–52.0)
Hemoglobin: 16.5 g/dL (ref 13.0–17.0)
MCH: 34.3 pg — ABNORMAL HIGH (ref 26.0–34.0)
MCHC: 33.2 g/dL (ref 30.0–36.0)
MCV: 103.3 fL — AB (ref 78.0–100.0)
Platelets: 68 10*3/uL — ABNORMAL LOW (ref 150–400)
RBC: 4.81 MIL/uL (ref 4.22–5.81)
RDW: 15.7 % — ABNORMAL HIGH (ref 11.5–15.5)
WBC: 4.8 10*3/uL (ref 4.0–10.5)

## 2014-08-02 LAB — I-STAT TROPONIN, ED: TROPONIN I, POC: 0.01 ng/mL (ref 0.00–0.08)

## 2014-08-02 LAB — PRO B NATRIURETIC PEPTIDE: Pro B Natriuretic peptide (BNP): 9080 pg/mL — ABNORMAL HIGH (ref 0–125)

## 2014-08-02 MED ORDER — LORAZEPAM 2 MG/ML IJ SOLN
1.0000 mg | Freq: Four times a day (QID) | INTRAMUSCULAR | Status: AC | PRN
  Administered 2014-08-02: 1 mg via INTRAVENOUS
  Filled 2014-08-02 (×3): qty 1

## 2014-08-02 MED ORDER — THIAMINE HCL 100 MG/ML IJ SOLN
100.0000 mg | Freq: Every day | INTRAMUSCULAR | Status: DC
  Administered 2014-08-03: 100 mg via INTRAVENOUS
  Filled 2014-08-02 (×3): qty 1

## 2014-08-02 MED ORDER — SODIUM CHLORIDE 0.9 % IJ SOLN
3.0000 mL | Freq: Two times a day (BID) | INTRAMUSCULAR | Status: DC
  Administered 2014-08-02 – 2014-08-12 (×17): 3 mL via INTRAVENOUS

## 2014-08-02 MED ORDER — VITAMIN B-1 100 MG PO TABS
100.0000 mg | ORAL_TABLET | Freq: Every day | ORAL | Status: DC
  Administered 2014-08-04: 100 mg via ORAL
  Filled 2014-08-02 (×3): qty 1

## 2014-08-02 MED ORDER — FOLIC ACID 400 MCG PO TABS: 400.0000 ug | ORAL_TABLET | Freq: Every day | ORAL | Status: DC

## 2014-08-02 MED ORDER — DEXTROSE 5 % IV SOLN
500.0000 mg | INTRAVENOUS | Status: DC
  Administered 2014-08-03 – 2014-08-07 (×5): 500 mg via INTRAVENOUS
  Filled 2014-08-02 (×5): qty 500

## 2014-08-02 MED ORDER — DEXTROSE 5 % IV SOLN
1.0000 g | INTRAVENOUS | Status: AC
  Administered 2014-08-03 – 2014-08-08 (×7): 1 g via INTRAVENOUS
  Filled 2014-08-02 (×7): qty 10

## 2014-08-02 MED ORDER — GABAPENTIN 800 MG PO TABS: 400.0000 mg | ORAL_TABLET | Freq: Two times a day (BID) | ORAL | Status: DC

## 2014-08-02 MED ORDER — PANTOPRAZOLE SODIUM 40 MG PO TBEC: 40.0000 mg | DELAYED_RELEASE_TABLET | Freq: Every day | ORAL | Status: DC

## 2014-08-02 MED ORDER — ASPIRIN EC 325 MG PO TBEC
325.0000 mg | DELAYED_RELEASE_TABLET | Freq: Every day | ORAL | Status: DC
  Filled 2014-08-02 (×2): qty 1

## 2014-08-02 MED ORDER — IPRATROPIUM-ALBUTEROL 0.5-2.5 (3) MG/3ML IN SOLN
3.0000 mL | Freq: Once | RESPIRATORY_TRACT | Status: AC
  Administered 2014-08-02: 3 mL via RESPIRATORY_TRACT
  Filled 2014-08-02: qty 3

## 2014-08-02 MED ORDER — FINASTERIDE 5 MG PO TABS
5.0000 mg | ORAL_TABLET | Freq: Every day | ORAL | Status: DC
  Filled 2014-08-02 (×2): qty 1

## 2014-08-02 MED ORDER — FOLIC ACID 0.5 MG HALF TAB
0.5000 mg | ORAL_TABLET | Freq: Every day | ORAL | Status: DC
  Filled 2014-08-02 (×2): qty 1

## 2014-08-02 MED ORDER — IPRATROPIUM-ALBUTEROL 0.5-2.5 (3) MG/3ML IN SOLN
3.0000 mL | RESPIRATORY_TRACT | Status: DC
  Administered 2014-08-02 – 2014-08-07 (×24): 3 mL via RESPIRATORY_TRACT
  Filled 2014-08-02 (×8): qty 3
  Filled 2014-08-02: qty 9
  Filled 2014-08-02 (×14): qty 3

## 2014-08-02 MED ORDER — PHENYTOIN SODIUM EXTENDED 100 MG PO CAPS
300.0000 mg | ORAL_CAPSULE | Freq: Every day | ORAL | Status: DC
  Administered 2014-08-03 (×2): 300 mg via ORAL
  Filled 2014-08-02 (×3): qty 3

## 2014-08-02 MED ORDER — NICOTINE 14 MG/24HR TD PT24
14.0000 mg | MEDICATED_PATCH | Freq: Every day | TRANSDERMAL | Status: DC
  Administered 2014-08-03 – 2014-08-17 (×15): 14 mg via TRANSDERMAL
  Filled 2014-08-02 (×15): qty 1

## 2014-08-02 MED ORDER — LORAZEPAM 1 MG PO TABS: 1.0000 mg | ORAL_TABLET | Freq: Four times a day (QID) | ORAL | Status: DC | PRN

## 2014-08-02 MED ORDER — MOMETASONE FURO-FORMOTEROL FUM 100-5 MCG/ACT IN AERO
2.0000 | INHALATION_SPRAY | Freq: Two times a day (BID) | RESPIRATORY_TRACT | Status: DC
  Administered 2014-08-03: 2 via RESPIRATORY_TRACT
  Filled 2014-08-02: qty 8.8

## 2014-08-02 MED ORDER — SODIUM CHLORIDE 0.9 % IV SOLN
INTRAVENOUS | Status: DC
  Administered 2014-08-02: via INTRAVENOUS

## 2014-08-02 MED ORDER — GABAPENTIN 800 MG PO TABS
800.0000 mg | ORAL_TABLET | Freq: Two times a day (BID) | ORAL | Status: DC
  Filled 2014-08-02: qty 1

## 2014-08-02 MED ORDER — TAMSULOSIN HCL 0.4 MG PO CAPS
0.4000 mg | ORAL_CAPSULE | Freq: Every day | ORAL | Status: DC
  Filled 2014-08-02 (×2): qty 1

## 2014-08-02 MED ORDER — GABAPENTIN 600 MG PO TABS
600.0000 mg | ORAL_TABLET | Freq: Two times a day (BID) | ORAL | Status: DC
  Filled 2014-08-02: qty 1

## 2014-08-02 MED ORDER — HEPARIN SODIUM (PORCINE) 5000 UNIT/ML IJ SOLN
5000.0000 [IU] | Freq: Three times a day (TID) | INTRAMUSCULAR | Status: DC
  Administered 2014-08-03 – 2014-08-21 (×56): 5000 [IU] via SUBCUTANEOUS
  Filled 2014-08-02 (×62): qty 1

## 2014-08-02 MED ORDER — ADULT MULTIVITAMIN W/MINERALS CH
1.0000 | ORAL_TABLET | Freq: Every day | ORAL | Status: DC
  Filled 2014-08-02 (×2): qty 1

## 2014-08-02 MED ORDER — CLONAZEPAM 0.5 MG PO TABS
0.5000 mg | ORAL_TABLET | Freq: Every day | ORAL | Status: DC
  Administered 2014-08-03 (×2): 0.5 mg via ORAL
  Filled 2014-08-02 (×2): qty 1

## 2014-08-02 MED ORDER — GABAPENTIN 400 MG PO CAPS
800.0000 mg | ORAL_CAPSULE | Freq: Two times a day (BID) | ORAL | Status: DC
  Administered 2014-08-03: 800 mg via ORAL
  Filled 2014-08-02: qty 2

## 2014-08-02 NOTE — ED Notes (Signed)
At urgent care pta for co sob with cough no pain dx with lt pneumonia given solumedrol 125 iv by medics pta wikth albuterol/atrovent inhaler urgent care gave zithromax po and rocephin im uinkn doses

## 2014-08-02 NOTE — ED Notes (Signed)
Returned from ct 

## 2014-08-02 NOTE — ED Notes (Signed)
Attempted report 

## 2014-08-02 NOTE — ED Notes (Signed)
Attempted report to CN

## 2014-08-02 NOTE — Progress Notes (Signed)
Peak flow best of out 3 was 150, with minimal effort, as patient is continually coughing.

## 2014-08-02 NOTE — ED Notes (Signed)
RT note: called to obtain peak flow on pt., upon arrival pt. frequently coughing, bringing up small amount mucus, 90-92% sats on 3 lpm n/c, b/l b.s. Rhonchi with good aeration, family @ bedside stated, "[facility] unable to obtain @ this time, oncoming RT made aware, RT to monitor.

## 2014-08-02 NOTE — H&P (Signed)
Date: 08/05/2014               Patient Name:  Kenneth GullyFrancis J Novakovich MRN: [id number]  DOB: 05/02/1951 Age / Sex: 63 y.o., male   PCP: Toma DeitersXaje A Hasanaj, MD         Medical Service: Internal Medicine Teaching Service         Attending Physician: Dr. Inez CatalinaEmily B Mullen, MD    First Contact: Dr. Valentino Saxonothman Pager: [phone]  Second Contact: Dr. Andrey CampanileWilson Pager: [phone]       After Hours (After 5p/  First Contact Pager: [phone]  weekends / holidays): Second Contact Pager: [phone]   Chief Complaint: Shortness of breath  History of Present Illness: 63 year old man with history of COPD, HTN, GERD, seizures, peripheral neuropathy presenting with shortness of breath for few days. It has been worsening. The neb tx given by EMS helped relieved his symptoms. No exacerbating factors. He also has sore throat and productive cough with green sputum, and wheezing. No hemoptysis. He has had subjective fevers, no chills, dizziness, and increased LE edema. He denies chest pain, nausea, vomiting, abdominal pain, hematochezia, melena, dysuria, hematuria, change in baseline paresthesias/weakness, or rash. He has chronic diarrhea at baseline.  He has had more falls this past week. Last fall was on Wednesday attributed to losing balance and severe baseline neuropathy. He normally walks with a cane. Family and patient denies LOC. He hit his left side of his body as well as his head.  He went to urgent care in [place] today and was diagnosed with LLL pneumonia. He was given Zithromax and IM Rocephin. He was given IV solumedrol 125 by EMS. [facility]. No sick contacts or recent travel. He has not been taking his Advair. He was hospitalized for pneumonia 3-4 years ago and per family he had cardiac arrest and was intubated in the ICU for about 5 days.  Meds: No current facility-administered medications for this encounter.   Current Outpatient Prescriptions  Medication Sig Dispense Refill  .  aspirin EC 325 MG tablet Take 325 mg by mouth daily.      Marland Kitchen. CALCIUM-VITAMIN D PO Take 1 tablet by mouth 3 (three) times daily. OTC unknown strength      . cholecalciferol (VITAMIN D) 1000 UNITS tablet Take 1,000 Units by mouth daily.      . clonazePAM (KLONOPIN) 0.5 MG tablet Take 0.5 mg by mouth at bedtime.      . Cyanocobalamin (B-12 PO) Take 1 tablet by mouth daily. OTC unknown strength      . esomeprazole (NEXIUM) 40 MG capsule Take 40 mg by mouth daily.      . finasteride (PROSCAR) 5 MG tablet Take 5 mg by mouth daily.      . Fluticasone-Salmeterol (ADVAIR) 250-50 MCG/DOSE AEPB Inhale 1 puff into the lungs every 12 (twelve) hours as needed. For shortness of breath      . folic acid (FOLVITE) 400 MCG tablet Take 400 mcg by mouth daily.      Marland Kitchen. gabapentin (NEURONTIN) 800 MG tablet Take 800 mg by mouth 2 (two) times daily. Also takes 1 tablet around supper and one tablet at bedtime  Takes gabapentin 600mg  tablet every morning and one 600mg  tablet about 5 hours later.      . Gabapentin, PHN, 300 MG TABS Take 1 tablet by mouth 2 (two) times daily. Takes one tablet every morning and one tablet about 5 hours later.  Also takes gabapentin 800mg  twice  daily in the evening.      Marland Kitchen LORazepam (ATIVAN) 0.5 MG tablet Take 0.5 mg by mouth every 6 (six) hours as needed. Anxiety      . MAGNESIUM PO Take 1 tablet by mouth daily. OTC unknown strength      . metoprolol succinate (TOPROL-XL) 50 MG 24 hr tablet Take 50 mg by mouth daily. Take with or immediately following a meal.      . phenytoin (DILANTIN) 100 MG ER capsule Take 300 mg by mouth at bedtime.      . potassium chloride SA (K-DUR,KLOR-CON) 20 MEQ tablet Take 20 mEq by mouth daily.      . Potassium Gluconate 595 MG CAPS Take 1 capsule by mouth daily.      Marland Kitchen pyridOXINE (VITAMIN B-6) 50 MG tablet Take 50 mg by mouth daily.      . Tamsulosin HCl (FLOMAX) 0.4 MG CAPS Take 0.4 mg by mouth daily.      Marland Kitchen thiamine (VITAMIN B-1) 100 MG tablet Take 100 mg by  mouth daily.      . zoledronic acid (RECLAST) 5 MG/100ML SOLN Inject 5 mg into the vein once. Once per year, last received about 5 months ago        Allergies: Allergies as of 07/28/2014  . (No Known Allergies)   Past Medical History  Diagnosis Date  . Neuromyotonia neuropathy  . COPD (chronic obstructive pulmonary disease)   . Hypertension   . Seizures 2 in past.  2012  . Peripheral vascular disease   . GERD (gastroesophageal reflux disease)    Past Surgical History  Procedure Laterality Date  . Finger fracture surgery  1980's  . Colonoscopy  x2  3 yrs ago  . Esophagogastroduodenoscopy endoscopy  x4  last one 10 yrs ago    with dilation  . Vascular surgery      bilateral legs   . Fracture surgery    . Cataract extraction w/phaco  08/21/2012    Procedure: CATARACT EXTRACTION PHACO AND INTRAOCULAR LENS PLACEMENT (IOC);  Surgeon: Gemma Payor, MD;  Location: [facility];  Service: Ophthalmology;  Laterality: Right;  CDE=12.47  . Cataract extraction w/phaco  09/18/2012    Procedure: CATARACT EXTRACTION PHACO AND INTRAOCULAR LENS PLACEMENT (IOC);  Surgeon: Gemma Payor, MD;  Location: [facility];  Service: Ophthalmology;  Laterality: Left;  CDE 10.77   History reviewed. No pertinent family history. History   Social History  . Marital Status: Married    Spouse Name: N/A    Number of Children: N/A  . Years of Education: N/A   Occupational History  . Not on file.   Social History Main Topics  . Smoking status: Current Every Day Smoker -- 1.00 packs/day    Types: Cigarettes  . Smokeless tobacco: Not on file  . Alcohol Use: 1.2 oz/week    2 Shots of liquor per week  . Drug Use: No  . Sexual Activity:    Other Topics Concern  . Not on file   Social History Narrative  . No narrative on file    Review of Systems: Constitutional: +subjective fevers, no chills Eyes: no vision changes Ears, nose, mouth, throat, and face: +cough Respiratory: +shortness of breath Cardiovascular: no  chest pain Gastrointestinal: no nausea/vomiting, no abdominal pain, no constipation, +chronic diarrhea Genitourinary: no dysuria, no hematuria Integument: no rash Hematologic/lymphatic: no bleeding/bruising, +edema Musculoskeletal: no arthralgias, no myalgias Neurological: +peripheral neuropathy, no weakness  Physical Exam: Blood pressure 121/62, pulse 91, temperature 98.4 F (36.9  C), temperature source Oral, resp. rate 24, height 5\' 6"  (1.676 m), weight 120 lb (54.432 kg), SpO2 91.00%. General Apperance: mild distress, emaciated Head: Normocephalic Eyes: PERRL, EOMI, anicteric sclera Ears: Normal external ear canal Nose: Nares normal, septum midline, mucosa normal Throat: Lips, mucosa and tongue normal  Neck: Supple, trachea midline Back: No tenderness or bony abnormality  Lungs: Bilateral rhonchi, no wheezes. +using accessory muscles Chest Wall: Nontender, no deformity Heart: Regular rate and rhythm, no murmur/rub/gallop Abdomen: Soft, nontender, nondistended, no rebound/guarding Extremities: Normal, atraumatic, warm and well perfused, trace to 1+ BLE edema at ankles Pulses: 2+ throughout Skin: No rashes or lesions Neurologic: Alert and oriented x 3. CNII-XII intact. Normal strength and sensation  Lab results: Basic Metabolic Panel:  Recent Labs  16/10/96 1712  NA 140  K 4.1  CL 99  CO2 26  GLUCOSE 116*  BUN 15  CREATININE 0.69  CALCIUM 8.0*   Liver Function Tests:  Recent Labs  07/22/2014 1712  AST 23  ALT 6  ALKPHOS 107  BILITOT 1.0  PROT 7.3  ALBUMIN 3.9   CBC:  Recent Labs  07/29/2014 1712  WBC 4.8  HGB 16.5  HCT 49.7  MCV 103.3*  PLT 68*   Cardiac Enzymes: Troponin POC 07/30/2014 1732 0.01  BNP:  Recent Labs  07/23/2014 1715  PROBNP 9080.0*    Imaging results:  Dg Chest Port 1 View  08/04/2014   CLINICAL DATA:  Short of breath for 1 day.  History of COPD.  EXAM: PORTABLE CHEST - 1 VIEW  COMPARISON:  07/30/2014 at 1448 hr  FINDINGS:  Cardiac silhouette is normal in size. Mild prominence of the pulmonary arteries. No mediastinal or hilar masses.  Lungs are hyperexpanded. There is mild interstitial thickening in the lung bases. The hazy opacity noted in the left mid lung on the earlier study is less apparent likely due to differences in patient positioning and technique. Pneumonia/infiltrate remains possible. A paucity of lung markings in the upper lobes is consistent with emphysema.  No pleural effusion or pneumothorax.  Bony thorax is demineralized. An old healed rib fractures noted on the left.  IMPRESSION: 1. COPD/emphysema. 2. Possible subtle left lower lobe infiltrate as described on the earlier exam. 3. No other evidence of acute cardiopulmonary disease.   Electronically Signed   By: Amie Portland M.D.   On: 07/23/2014 17:47    Other results: EKG: normal sinus rhythm. ST depression in II, III, aVF. T wave inversion in V3-4. No other changes compared to previous EKG  Assessment & Plan by Problem: Active Problems:   Pneumonia  SOB, cough: Community acquired pneumonia versus COPD exacerbation. Chest XR here with possible subtle left lower lobe infiltrate and no other evidence of acute cardiopulmonary disease. He has been afebrile in the ED. No leukocytosis on CBC. Meets SIRS criteria with tachycardia and tachypnea. Low concern for acute MI as pt denies chest pain and has negative troponin. EKG does show ST depression in inferior leads. Low concern for ACS at this point. He does have some trace to 1+ pitting edema of LE. BNP was elevated to 9080. No previous diagnosis of heart failure. Low risk by Anner Crete and Geneva score for PE. -Repeat EKG in AM -Trend troponins -Repeat CXR 2v in AM -2D Echo -Continue Azithromycin 500mg  daily for 7 days total abx course -Continue Rocephin 1g daily for 7 days total abx course -Duoneb 3mL Q4hr -Continue home Advair as Dulera 100-75mcg 2 puff BID -Sputum Cx -Urine strep pneumoniae  antigen,  legionella antigen  Falls: pt reports imbalance 2/2 peripheral neuropathy. Uses cane to ambulate. Head CT without acute intracranial abnormality -PT/OT -TSH  Thrombocytopenia: AST/ALT wnl making liver disease unlikely. Likely 2/2 alcohol use. No past CBC for comparison -Continue to monitor -HIV antibody  Alcohol use: -CIWA protocol  HTN -Continue home ASA 325mg  daily  GERD -40mg  pantroprazole   Seizures -Continue home Klonopin 0.5 QHS -Continue home phenytoin 300mg  QHS  Peripheral neuropathy  -Decrease home dose gabapentin to 400mg  BID  BPH -continue home finasteride 5mg  daily -continue home tansulosin 0.4mg  daily  FEN:  -NPO -NS@75cc /hr -folic acid 0.5mg  daily -MVI daily -vitamin B1 100mg  daily  DVT ppx: subq heparin 5000u TID  Dispo: Disposition is deferred at this time, awaiting improvement of current medical problems. Anticipated discharge in approximately 2 day(s).   The patient does have a current PCP (Toma DeitersXaje A Hasanaj, MD) and does not need [facility] appointment after discharge.  The patient does not have transportation limitations that hinder transportation to clinic appointments.  Signed: Griffin BasilJennifer Eliakim Tendler, MD 07/16/2014, 7:06 PM

## 2014-08-02 NOTE — ED Provider Notes (Signed)
CSN: [id number]     Arrival date & time 07/17/2014  1703 History   First MD Initiated Contact with Patient 07/19/2014 1714     Chief Complaint  Patient presents with  . Shortness of Breath     (Consider location/radiation/quality/duration/timing/severity/associated sxs/prior Treatment) HPI Comments: Patient is a 63 year old male with history of COPD. He presents today with complaints of chest congestion, productive cough, and difficulty breathing that has worsened over the past several days. His cough is intermittently productive of a yellow sputum. He was seen [facility] at [facility] care in [place] and diagnosed with pneumonia. He was given by mouth Zithromax and IM Rocephin at urgent care, and Solu-Medrol IV by EMS.  Patient is a 63 y.o. male presenting with shortness of breath. The history is provided by the patient.  Shortness of Breath Severity:  Moderate Onset quality:  Gradual Duration:  3 days Timing:  Constant Progression:  Worsening Chronicity:  New Context: activity   Relieved by:  Nothing Worsened by:  Nothing tried Ineffective treatments:  None tried Associated symptoms: no fever     Past Medical History  Diagnosis Date  . Neuromyotonia neuropathy  . COPD (chronic obstructive pulmonary disease)   . Hypertension   . Seizures 2 in past.  2012  . Peripheral vascular disease   . GERD (gastroesophageal reflux disease)    Past Surgical History  Procedure Laterality Date  . Finger fracture surgery  1980's  . Colonoscopy  x2  3 yrs ago  . Esophagogastroduodenoscopy endoscopy  x4  last one 10 yrs ago    with dilation  . Vascular surgery      bilateral legs   . Fracture surgery    . Cataract extraction w/phaco  08/21/2012    Procedure: CATARACT EXTRACTION PHACO AND INTRAOCULAR LENS PLACEMENT (IOC);  Surgeon: Gemma PayorKerry Hunt, MD;  Location: [facility];  Service: Ophthalmology;  Laterality: Right;  CDE=12.47  . Cataract extraction w/phaco  09/18/2012    Procedure: CATARACT  EXTRACTION PHACO AND INTRAOCULAR LENS PLACEMENT (IOC);  Surgeon: Gemma PayorKerry Hunt, MD;  Location: [facility];  Service: Ophthalmology;  Laterality: Left;  CDE 10.77   History reviewed. No pertinent family history. History  Substance Use Topics  . Smoking status: Current Every Day Smoker -- 1.00 packs/day    Types: Cigarettes  . Smokeless tobacco: Not on file  . Alcohol Use: 1.2 oz/week    2 Shots of liquor per week    Review of Systems  Constitutional: Negative for fever.  Respiratory: Positive for shortness of breath.   All other systems reviewed and are negative.     Allergies  Review of patient's allergies indicates no known allergies.  Home Medications   Prior to Admission medications   Medication Sig Start Date End Date Taking? Authorizing Provider  aspirin EC 325 MG tablet Take 325 mg by mouth daily.    Historical Provider, MD  CALCIUM-VITAMIN D PO Take 1 tablet by mouth 3 (three) times daily. OTC unknown strength    Historical Provider, MD  cholecalciferol (VITAMIN D) 1000 UNITS tablet Take 1,000 Units by mouth daily.    Historical Provider, MD  clonazePAM (KLONOPIN) 0.5 MG tablet Take 0.5 mg by mouth at bedtime.    Historical Provider, MD  Cyanocobalamin (B-12 PO) Take 1 tablet by mouth daily. OTC unknown strength    Historical Provider, MD  esomeprazole (NEXIUM) 40 MG capsule Take 40 mg by mouth daily.    Historical Provider, MD  finasteride (PROSCAR) 5 MG tablet Take 5  mg by mouth daily.    Historical Provider, MD  Fluticasone-Salmeterol (ADVAIR) 250-50 MCG/DOSE AEPB Inhale 1 puff into the lungs every 12 (twelve) hours as needed. For shortness of breath    Historical Provider, MD  folic acid (FOLVITE) 400 MCG tablet Take 400 mcg by mouth daily.    Historical Provider, MD  gabapentin (NEURONTIN) 800 MG tablet Take 800 mg by mouth 2 (two) times daily. Also takes 1 tablet around supper and one tablet at bedtime  Takes gabapentin 600mg  tablet every morning and one 600mg  tablet about  5 hours later.    Historical Provider, MD  Gabapentin, PHN, 300 MG TABS Take 1 tablet by mouth 2 (two) times daily. Takes one tablet every morning and one tablet about 5 hours later.  Also takes gabapentin 800mg  twice daily in the evening.    Historical Provider, MD  LORazepam (ATIVAN) 0.5 MG tablet Take 0.5 mg by mouth every 6 (six) hours as needed. Anxiety    Historical Provider, MD  MAGNESIUM PO Take 1 tablet by mouth daily. OTC unknown strength    Historical Provider, MD  metoprolol succinate (TOPROL-XL) 50 MG 24 hr tablet Take 50 mg by mouth daily. Take with or immediately following a meal.    Historical Provider, MD  phenytoin (DILANTIN) 100 MG ER capsule Take 300 mg by mouth at bedtime.    Historical Provider, MD  potassium chloride SA (K-DUR,KLOR-CON) 20 MEQ tablet Take 20 mEq by mouth daily.    Historical Provider, MD  Potassium Gluconate 595 MG CAPS Take 1 capsule by mouth daily.    Historical Provider, MD  pyridOXINE (VITAMIN B-6) 50 MG tablet Take 50 mg by mouth daily.    Historical Provider, MD  Tamsulosin HCl (FLOMAX) 0.4 MG CAPS Take 0.4 mg by mouth daily.    Historical Provider, MD  thiamine (VITAMIN B-1) 100 MG tablet Take 100 mg by mouth daily.    Historical Provider, MD  zoledronic acid (RECLAST) 5 MG/100ML SOLN Inject 5 mg into the vein once. Once per year, last received about 5 months ago    Historical Provider, MD   BP 119/63  Pulse 99  Temp(Src) 98.4 F (36.9 C) (Oral)  Resp 21  Ht 5\' 6"  (1.676 m)  Wt 120 lb (54.432 kg)  BMI 19.38 kg/m2  SpO2 92% Physical Exam  Nursing note and vitals reviewed. Constitutional: He is oriented to person, place, and time. No distress.  HENT:  Head: Normocephalic and atraumatic.  Mouth/Throat: Oropharynx is clear and moist.  Neck: Normal range of motion. Neck supple.  Cardiovascular: Normal rate, regular rhythm and normal heart sounds.   No murmur heard. Pulmonary/Chest: He is in respiratory distress. He has wheezes. He has rales.   Patient is in mild respiratory distress. He has bilateral rhonchi with expiration.  Abdominal: Soft. Bowel sounds are normal. He exhibits no distension. There is no tenderness.  Musculoskeletal: Normal range of motion. He exhibits edema.  There is 1+ bilateral lower extremity pitting edema  Neurological: He is alert and oriented to person, place, and time.  Skin: Skin is warm and dry. He is not diaphoretic.    ED Course  Procedures (including critical care time) Labs Review Labs Reviewed  CBC  COMPREHENSIVE METABOLIC PANEL  PRO B NATRIURETIC PEPTIDE  I-STAT TROPOININ, ED    Imaging Review No results found.   EKG Interpretation   Date/Time:  Friday August 02 2014 17:07:50 EDT Ventricular Rate:  97 PR Interval:  143 QRS Duration: 96 QT  Interval:  362 QTC Calculation: 460 R Axis:   75 Text Interpretation:  Sinus rhythm Biatrial enlargement Abnormal R-wave  progression, early transition Abnrm T, consider ischemia, anterolateral  lds Baseline wander in lead(s) V6 Confirmed by DELOS  MD, Arwen Haseley ([id number])  on 07/23/2014 5:42:24 PM      MDM   Final diagnoses:  None    Patient sent from urgent care for evaluation of productive cough and difficulty breathing for the past several days. He has a history of COPD and this appears to be an exacerbation of this and chest x-ray would suggest an overlying pneumonia. He was given ceftriaxone and Zithromax prior to being brought here. He was also given breathing treatments and steroids in the ambulance. He continues to have an oxygen requirement with saturations in the low 90s while on 3 L by nasal cannula. For this reason, the teaching service has been consulted and will evaluate the patient in the ER.    Geoffery Lyonsouglas Brennan Litzinger, MD 07/15/2014 [phone]

## 2014-08-02 NOTE — ED Notes (Signed)
To ct

## 2014-08-03 ENCOUNTER — Inpatient Hospital Stay (HOSPITAL_COMMUNITY): Payer: Medicare Other

## 2014-08-03 DIAGNOSIS — G629 Polyneuropathy, unspecified: Secondary | ICD-10-CM

## 2014-08-03 DIAGNOSIS — Z720 Tobacco use: Secondary | ICD-10-CM | POA: Diagnosis present

## 2014-08-03 DIAGNOSIS — R569 Unspecified convulsions: Secondary | ICD-10-CM

## 2014-08-03 DIAGNOSIS — Z9181 History of falling: Secondary | ICD-10-CM

## 2014-08-03 DIAGNOSIS — R05 Cough: Secondary | ICD-10-CM

## 2014-08-03 DIAGNOSIS — R600 Localized edema: Secondary | ICD-10-CM | POA: Diagnosis present

## 2014-08-03 DIAGNOSIS — J449 Chronic obstructive pulmonary disease, unspecified: Secondary | ICD-10-CM | POA: Diagnosis present

## 2014-08-03 DIAGNOSIS — F1010 Alcohol abuse, uncomplicated: Secondary | ICD-10-CM

## 2014-08-03 DIAGNOSIS — I369 Nonrheumatic tricuspid valve disorder, unspecified: Secondary | ICD-10-CM

## 2014-08-03 DIAGNOSIS — R609 Edema, unspecified: Secondary | ICD-10-CM | POA: Diagnosis present

## 2014-08-03 DIAGNOSIS — E785 Hyperlipidemia, unspecified: Secondary | ICD-10-CM | POA: Diagnosis present

## 2014-08-03 DIAGNOSIS — R296 Repeated falls: Secondary | ICD-10-CM

## 2014-08-03 DIAGNOSIS — I10 Essential (primary) hypertension: Secondary | ICD-10-CM

## 2014-08-03 DIAGNOSIS — K219 Gastro-esophageal reflux disease without esophagitis: Secondary | ICD-10-CM

## 2014-08-03 DIAGNOSIS — N400 Enlarged prostate without lower urinary tract symptoms: Secondary | ICD-10-CM

## 2014-08-03 DIAGNOSIS — R0602 Shortness of breath: Secondary | ICD-10-CM

## 2014-08-03 DIAGNOSIS — D696 Thrombocytopenia, unspecified: Secondary | ICD-10-CM

## 2014-08-03 DIAGNOSIS — R9431 Abnormal electrocardiogram [ECG] [EKG]: Secondary | ICD-10-CM | POA: Diagnosis present

## 2014-08-03 LAB — BASIC METABOLIC PANEL
Anion gap: 11 (ref 5–15)
BUN: 16 mg/dL (ref 6–23)
CALCIUM: 7.4 mg/dL — AB (ref 8.4–10.5)
CO2: 30 mEq/L (ref 19–32)
CREATININE: 0.56 mg/dL (ref 0.50–1.35)
Chloride: 96 mEq/L (ref 96–112)
GFR calc Af Amer: >90 mL/min (ref >90)
GFR calc non Af Amer: >90 mL/min (ref >90)
GLUCOSE: 127 mg/dL — AB (ref 70–99)
Potassium: 3.8 mEq/L (ref 3.7–5.3)
SODIUM: 137 meq/L (ref 137–147)

## 2014-08-03 LAB — BLOOD GAS, ARTERIAL
ACID-BASE EXCESS: 3 mmol/L — AB (ref 0.0–2.0)
ACID-BASE EXCESS: 4.2 mmol/L — AB (ref 0.0–2.0)
BICARBONATE: 29.5 meq/L — AB (ref 20.0–24.0)
Bicarbonate: 29.4 mEq/L — ABNORMAL HIGH (ref 20.0–24.0)
DELIVERY SYSTEMS: POSITIVE
Drawn by: 347621
Drawn by: 246861
Expiratory PAP: 6
FIO2: 0.6 %
Inspiratory PAP: 12
LHR: 10 {breaths}/min
O2 Content: 5 L/min
O2 Saturation: 85.5 %
O2 Saturation: 97.7 %
PATIENT TEMPERATURE: 98.6
PCO2 ART: 69.1 mmHg — AB (ref 35.0–45.0)
Patient temperature: 98.6
TCO2: 31.7 mmol/L (ref 0–100)
TCO2: 31 mmol/L (ref 0–100)
pCO2 arterial: 54 mmHg — ABNORMAL HIGH (ref 35.0–45.0)
pH, Arterial: 7.254 — ABNORMAL LOW (ref 7.350–7.450)
pH, Arterial: 7.355 (ref 7.350–7.450)
pO2, Arterial: 60.4 mmHg — ABNORMAL LOW (ref 80.0–100.0)
pO2, Arterial: 98 mmHg (ref 80.0–100.0)

## 2014-08-03 LAB — STREP PNEUMONIAE URINARY ANTIGEN: Strep Pneumo Urinary Antigen: NEGATIVE

## 2014-08-03 LAB — CBC
HCT: 42.7 % (ref 39.0–52.0)
Hemoglobin: 14.4 g/dL (ref 13.0–17.0)
MCH: 34.4 pg — ABNORMAL HIGH (ref 26.0–34.0)
MCHC: 33.7 g/dL (ref 30.0–36.0)
MCV: 102.2 fL — AB (ref 78.0–100.0)
PLATELETS: 66 10*3/uL — AB (ref 150–400)
RBC: 4.18 MIL/uL — ABNORMAL LOW (ref 4.22–5.81)
RDW: 16 % — AB (ref 11.5–15.5)
WBC: 3.4 10*3/uL — AB (ref 4.0–10.5)

## 2014-08-03 LAB — TSH: TSH: 0.292 u[IU]/mL — ABNORMAL LOW (ref 0.350–4.500)

## 2014-08-03 LAB — T4, FREE: Free T4: 0.85 ng/dL (ref 0.80–1.80)

## 2014-08-03 LAB — MRSA PCR SCREENING: MRSA by PCR: POSITIVE — AB

## 2014-08-03 LAB — GLUCOSE, CAPILLARY: Glucose-Capillary: 101 mg/dL — ABNORMAL HIGH (ref 70–99)

## 2014-08-03 LAB — TROPONIN I
Troponin I: <0.3 ng/mL (ref <0.30)
Troponin I: <0.3 ng/mL (ref <0.30)

## 2014-08-03 LAB — HIV ANTIBODY (ROUTINE TESTING W REFLEX): HIV: NONREACTIVE

## 2014-08-03 MED ORDER — LORAZEPAM 2 MG/ML IJ SOLN
0.5000 mg | INTRAMUSCULAR | Status: AC
  Administered 2014-08-03: 0.5 mg via INTRAVENOUS

## 2014-08-03 MED ORDER — MUPIROCIN 2 % EX OINT
1.0000 "application " | TOPICAL_OINTMENT | Freq: Two times a day (BID) | CUTANEOUS | Status: AC
  Administered 2014-08-03 – 2014-08-07 (×9): 1 via NASAL
  Filled 2014-08-03 (×2): qty 22

## 2014-08-03 MED ORDER — SODIUM CHLORIDE 0.9 % IV SOLN
INTRAVENOUS | Status: DC
  Administered 2014-08-03 – 2014-08-05 (×3): via INTRAVENOUS
  Administered 2014-08-05: 50 mL/h via INTRAVENOUS

## 2014-08-03 MED ORDER — FUROSEMIDE 10 MG/ML IJ SOLN
20.0000 mg | Freq: Once | INTRAMUSCULAR | Status: AC
  Administered 2014-08-03: 20 mg via INTRAVENOUS

## 2014-08-03 MED ORDER — CETYLPYRIDINIUM CHLORIDE 0.05 % MT LIQD
7.0000 mL | Freq: Two times a day (BID) | OROMUCOSAL | Status: DC
  Administered 2014-08-03: 7 mL via OROMUCOSAL

## 2014-08-03 MED ORDER — HYDROCOD POLST-CHLORPHEN POLST 10-8 MG/5ML PO LQCR
5.0000 mL | Freq: Two times a day (BID) | ORAL | Status: DC | PRN
  Administered 2014-08-03: 5 mL via ORAL
  Filled 2014-08-03: qty 5

## 2014-08-03 MED ORDER — GABAPENTIN 400 MG PO CAPS
400.0000 mg | ORAL_CAPSULE | Freq: Two times a day (BID) | ORAL | Status: DC
  Administered 2014-08-03 (×2): 400 mg via ORAL
  Filled 2014-08-03 (×4): qty 1

## 2014-08-03 MED ORDER — LORAZEPAM 2 MG/ML IJ SOLN: 0.5000 mg | Freq: Once | INTRAMUSCULAR | Status: DC

## 2014-08-03 MED ORDER — METHYLPREDNISOLONE SODIUM SUCC 125 MG IJ SOLR
125.0000 mg | Freq: Every day | INTRAMUSCULAR | Status: DC
  Administered 2014-08-03: 125 mg via INTRAVENOUS
  Filled 2014-08-03: qty 2

## 2014-08-03 MED ORDER — CETYLPYRIDINIUM CHLORIDE 0.05 % MT LIQD
7.0000 mL | Freq: Two times a day (BID) | OROMUCOSAL | Status: DC
  Administered 2014-08-03 – 2014-08-06 (×7): 7 mL via OROMUCOSAL

## 2014-08-03 MED ORDER — METHYLPREDNISOLONE SODIUM SUCC 125 MG IJ SOLR
60.0000 mg | Freq: Four times a day (QID) | INTRAMUSCULAR | Status: DC
  Administered 2014-08-03 – 2014-08-05 (×8): 60 mg via INTRAVENOUS
  Filled 2014-08-03 (×12): qty 0.96

## 2014-08-03 MED ORDER — MORPHINE SULFATE 2 MG/ML IJ SOLN: 1.0000 mg | INTRAMUSCULAR | Status: DC | PRN

## 2014-08-03 MED ORDER — IBUPROFEN 200 MG PO TABS
200.0000 mg | ORAL_TABLET | ORAL | Status: DC | PRN
  Administered 2014-08-03 – 2014-08-04 (×2): 200 mg via ORAL
  Filled 2014-08-03 (×2): qty 1

## 2014-08-03 MED ORDER — FUROSEMIDE 10 MG/ML IJ SOLN
INTRAMUSCULAR | Status: AC
  Filled 2014-08-03: qty 4

## 2014-08-03 MED ORDER — IPRATROPIUM-ALBUTEROL 0.5-2.5 (3) MG/3ML IN SOLN
3.0000 mL | Freq: Four times a day (QID) | RESPIRATORY_TRACT | Status: DC | PRN
  Administered 2014-08-07 (×2): 3 mL via RESPIRATORY_TRACT
  Filled 2014-08-03 (×2): qty 3

## 2014-08-03 MED ORDER — CHLORHEXIDINE GLUCONATE 0.12 % MT SOLN
15.0000 mL | Freq: Two times a day (BID) | OROMUCOSAL | Status: DC
  Administered 2014-08-03 – 2014-08-06 (×7): 15 mL via OROMUCOSAL
  Filled 2014-08-03 (×7): qty 15

## 2014-08-03 MED ORDER — LORAZEPAM 2 MG/ML IJ SOLN
INTRAMUSCULAR | Status: AC
  Filled 2014-08-03: qty 1

## 2014-08-03 MED ORDER — CHLORHEXIDINE GLUCONATE CLOTH 2 % EX PADS
6.0000 | MEDICATED_PAD | Freq: Every day | CUTANEOUS | Status: AC
  Administered 2014-08-03 – 2014-08-07 (×5): 6 via TOPICAL

## 2014-08-03 NOTE — H&P (Signed)
  Date: 08/03/2014  Patient name: Kenneth Bass Scroggins  Medical record number: [id number]  Date of birth: November 21, 1950   I have seen and evaluated Kenneth Bass Gsell and discussed their care with the Residency Team.  Briefly, Mr. Emelda FearFerguson is a 63yo man with PMH Of COPD, HTN, GERD, seizure, peripheral neuropathy presenting for worsening SOB which improved with nebulizer treatment.  He also reported sore throat, cough and wheezing.  He further has a history of falling, and had increased falls in this past week.  He has required intubation before for PNA and COPD exacerbation.  [place] was [place] in [place] and a CXR revealed a possible LLL pneumonia, however, this was not completely apparent on CXR done here at [facility].  He was admitted to the stepdown unit, given IV antibiotics and steroids.  He has not been taking his advair.  He was also noted to have ST segment depression on EKG in the inferolateral leads, CE X 2 have been negative.  CT head only had chronic changes.  When I saw him, he had progressed to BIPAP, however, he was alert and answering questions.  Pulse Ox was 100%.  PCCM had been consulted  Assessment and Plan: I have seen and evaluated the patient as outlined above. I agree with the formulated Assessment and Plan as detailed in the residents' admission note, with the following changes:   1. CAP with COPD exacerbation - Symptoms improved with BIPAP and CXR not convincing for true PNA - However, he will be treated with antibiotics - PCCM was consulted overnight and will transfer to ICU as he is high risk for need to be intubated - COPD therapy as tolerated - Sputum culture, blood cultures  2. SOB, EKG changes - I think a cardiac cause for his acute SOB cannot be totally ruled out at this point - He has an elevated pro-BNP, possible interstitial edema on CXR and EKG changes - Would trend CE, repeat EKG  In the AM - Consider one time dose of lasix to see if his breathing improves - TTE  ordered  3. H/O falls - Check TSH, it is low, will check T4  Other issues per resident note.     Inez CatalinaEmily B Mullen, MD 10/24/201510:27 AM

## 2014-08-03 NOTE — Consult Note (Signed)
PULMONARY / CRITICAL CARE MEDICINE   Name: Kenneth Bass MRN: [id number] DOB: [birth year]    ADMISSION DATE:  07/18/2014 CONSULTATION DATE:  10/24  REFERRING MD :  Kenneth Bass   CHIEF COMPLAINT:  Acute on chronic respiratory failure   INITIAL PRESENTATION:  [age] yom smoker w/ sig h/o COPD, ETOH and benzo dep anxiety. Admitted 10/23 w/ working dx CAP and AECOPD. Developed worsening resp failure and delirium/agitation on 10/24 PCCM asked to assess.   STUDIES:    SIGNIFICANT EVENTS: 10/23: admitted 10/24: progressive resp failure, placed on BIPAP. Transferred to ICU    HISTORY OF PRESENT ILLNESS:   63 year old man with history of COPD, HTN, GERD, seizures, peripheral neuropathy presented to ER on 10/23  with 5 d h/o progressive shortness of breath. The neb tx given by EMS helped relieved his symptoms. No exacerbating factors. He also has sore throat and productive cough with green sputum, and wheezing. No hemoptysis. He has had subjective fevers, no chills, dizziness, and increased LE edema. He denied chest pain, nausea, vomiting, abdominal pain, hematochezia, melena, dysuria, hematuria, change in baseline paresthesias/weakness, or rash. He has chronic diarrhea at baseline.  He had more falls this past week. Last fall was on Wednesday attributed to losing balance and severe baseline neuropathy. He normally walks with a cane. Family and patient denies LOC. He hit his left side of his body as well as his head.  [facility] and was diagnosed with LLL pneumonia. He was given Zithromax and IM Rocephin. He was given IV solumedrol 125 by EMS. [facility]. No sick contacts or recent travel. Admitted to the SDU, treated w/ O2, nebs, abx and systemic steroids. The AM on 10/24 had progressive dyspnea and some worsening infiltrates on CXR.    PAST MEDICAL HISTORY :   has a past medical history of Neuromyotonia (neuropathy); COPD (chronic  obstructive pulmonary disease); Hypertension; Seizures (2 in past.  2012); Peripheral vascular disease; and GERD (gastroesophageal reflux disease).  has past surgical history that includes Finger fracture surgery (1980's); Colonoscopy (x2  3 yrs ago); Esophagogastroduodenoscopy endoscopy (x4  last one 10 yrs ago); Vascular surgery; Fracture surgery; Cataract extraction w/PHACO (08/21/2012); and Cataract extraction w/PHACO (09/18/2012). Prior to Admission medications   Medication Sig Start Date End Date Taking? Authorizing Provider  clonazePAM (KLONOPIN) 0.5 MG tablet Take 0.5 mg by mouth 2 (two) times daily as needed for anxiety.   Yes Historical Provider, MD  Cyanocobalamin (B-12 PO) Take 1 tablet by mouth daily. OTC unknown strength   Yes Historical Provider, MD  esomeprazole (NEXIUM) 40 MG capsule Take 40 mg by mouth daily.   Yes Historical Provider, MD  finasteride (PROSCAR) 5 MG tablet Take 5 mg by mouth daily.   Yes Historical Provider, MD  folic acid (FOLVITE) 400 MCG tablet Take 400 mcg by mouth daily.   Yes Historical Provider, MD  furosemide (LASIX) 20 MG tablet Take 20 mg by mouth as needed for edema.   Yes Historical Provider, MD  gabapentin (NEURONTIN) 600 MG tablet Take 600 mg by mouth 2 (two) times daily.    Yes Historical Provider, MD  gabapentin (NEURONTIN) 800 MG tablet Take 800 mg by mouth 2 (two) times daily. Also takes 1 tablet around supper and one tablet at bedtime  Takes gabapentin 600mg  tablet every morning and one 600mg  tablet about 5 hours later.   Yes Historical Provider, MD  LORazepam (ATIVAN) 0.5 MG tablet Take 0.5 mg  by mouth 4 (four) times daily. Anxiety   Yes Historical Provider, MD  Magnesium 300 MG CAPS Take 300 mg by mouth daily.   Yes Historical Provider, MD  metoprolol succinate (TOPROL-XL) 50 MG 24 hr tablet Take 50 mg by mouth daily. Take with or immediately following a meal.   Yes Historical Provider, MD  phenytoin (DILANTIN) 100 MG ER capsule Take 300 mg by  mouth at bedtime.    Yes Historical Provider, MD  potassium chloride SA (K-DUR,KLOR-CON) 20 MEQ tablet Take 20 mEq by mouth daily.   Yes Historical Provider, MD  pyridOXINE (VITAMIN B-6) 50 MG tablet Take 50 mg by mouth daily.   Yes Historical Provider, MD  Tamsulosin HCl (FLOMAX) 0.4 MG CAPS Take 0.4 mg by mouth daily.   Yes Historical Provider, MD  thiamine (VITAMIN B-1) 100 MG tablet Take 100 mg by mouth daily.   Yes Historical Provider, MD   No Known Allergies  FAMILY HISTORY:  has no family status information on file.  SOCIAL HISTORY:  reports that he has been smoking Cigarettes.  He has been smoking about 1.00 pack per day. He does not have any smokeless tobacco history on file. He reports that he drinks about 1.2 ounces of alcohol per week. He reports that he does not use illicit drugs.  REVIEW OF SYSTEMS:  Unable on bipap , denies CP, gets agitated & upset at wife for answering questions  SUBJECTIVE:  Restless  VITAL SIGNS: Temp:  [97.6 F (36.4 C)-98.4 F (36.9 C)] 97.9 F (36.6 C) (10/24 0345) Pulse Rate:  [83-108] 108 (10/24 0650) Resp:  [14-31] 31 (10/24 0650) BP: (99-138)/(57-86) 138/86 mmHg (10/24 0534) SpO2:  [80 %-99 %] 99 % (10/24 0844) FiO2 (%):  [60 %] 60 % (10/24 0844) Weight:  [51.4 kg (113 lb 5.1 oz)-54.432 kg (120 lb)] 52.4 kg (115 lb 8.3 oz) (10/24 0345) HEMODYNAMICS:   VENTILATOR SETTINGS: Vent Mode:  [-]  FiO2 (%):  [60 %] 60 % INTAKE / OUTPUT:  Intake/Output Summary (Last 24 hours) at 08/03/14 0858 Last data filed at 08/03/14 0617  Gross per 24 hour  Intake      0 ml  Output    225 ml  Net   -225 ml    PHYSICAL EXAMINATION: General:  Chronically ill appearing 63 yo male, appears older than stated age. + accessory muscle use on NIPPV Neuro:  Awake, agitated at times. Follows commands  HEENT:  [place], BIPAP mask in place  Cardiovascular:  rrr Lungs:  Decreased t/o + accessory muscle use w/ some mild paradoxical efforts.  Abdomen:  Soft, + bowel  sounds  Musculoskeletal:  Intact  Skin:  Intact warm, brisk CR   LABS:  CBC  Recent Labs Lab 07/20/2014 1712 08/03/14 0500  WBC 4.8 3.4*  HGB 16.5 14.4  HCT 49.7 42.7  PLT 68* 66*   Coag's No results found for this basename: APTT, INR,  in the last 168 hours BMET  Recent Labs Lab 07/14/2014 1712 08/03/14 0500  NA 140 137  K 4.1 3.8  CL 99 96  CO2 26 30  BUN 15 16  CREATININE 0.69 0.56  GLUCOSE 116* 127*   Electrolytes  Recent Labs Lab 08/03/2014 1712 08/03/14 0500  CALCIUM 8.0* 7.4*   Sepsis Markers No results found for this basename: LATICACIDVEN, PROCALCITON, O2SATVEN,  in the last 168 hours ABG  Recent Labs Lab 08/03/14 0617  PHART 7.254*  PCO2ART 69.1*  PO2ART 60.4*   Liver Enzymes  Recent  Labs Lab 11-16-13 1712  AST 23  ALT 6  ALKPHOS 107  BILITOT 1.0  ALBUMIN 3.9   Cardiac Enzymes  Recent Labs Lab 11-16-13 1715 11-16-13 2331 08/03/14 0500  TROPONINI  --  <0.30 <0.30  PROBNP 9080.0*  --   --    Glucose No results found for this basename: GLUCAP,  in the last 168 hours  Imaging Ct Head Wo Contrast  11/15/2013   CLINICAL DATA:  History of fall at home.  EXAM: CT HEAD WITHOUT CONTRAST  TECHNIQUE: Contiguous axial images were obtained from the base of the skull through the vertex without intravenous contrast.  COMPARISON:  Head CT 09/11/2013 and head CT 11/06/2010  FINDINGS: Stable mild cerebral volume loss. Stable chronic moderate chronic microvascular ischemic changes in the periventricular white matter bilaterally. Negative for intra-axial hemorrhage, extra-axial hemorrhage, mass effect, mass lesion, or hydrocephalus.  Visualized paranasal sinuses and mastoid air cells are clear. The skull is intact.  IMPRESSION: No acute intracranial abnormality.  Chronic microvascular ischemic changes.   Electronically Signed   By: Britta MccreedySusan  Turner M.D.   On: 11/15/2013 21:07   Dg Chest Port 1 View  11/15/2013   CLINICAL DATA:  Short of breath for 1 day.   History of COPD.  EXAM: PORTABLE CHEST - 1 VIEW  COMPARISON:  11/15/2013 at 1448 hr  FINDINGS: Cardiac silhouette is normal in size. Mild prominence of the pulmonary arteries. No mediastinal or hilar masses.  Lungs are hyperexpanded. There is mild interstitial thickening in the lung bases. The hazy opacity noted in the left mid lung on the earlier study is less apparent likely due to differences in patient positioning and technique. Pneumonia/infiltrate remains possible. A paucity of lung markings in the upper lobes is consistent with emphysema.  No pleural effusion or pneumothorax.  Bony thorax is demineralized. An old healed rib fractures noted on the left.  IMPRESSION: 1. COPD/emphysema. 2. Possible subtle left lower lobe infiltrate as described on the earlier exam. 3. No other evidence of acute cardiopulmonary disease.   Electronically Signed   By: Amie Portlandavid  Ormond M.D.   On: 11/15/2013 17:47     ASSESSMENT / PLAN:  PULMONARY OETT A: Acute hypoxic and hypercarbic respiratory failure  PNA  AECOPD  P:   BIPAP, f/u abg Move to ICU, high risk for intubation  Scheduled nebs Systemic steroids See ID section   CARDIOVASCULAR CVL A: SIRS/sepsis  P:  Tele  IV hydration  Ck lactic acid   RENAL A:   No acute issues  P:   IV hydration  Serial chems   GASTROINTESTINAL A:   Aspiration risk  P:   NPO  PPI   HEMATOLOGIC A:   Mild thrombocytopenia   P:  Trend CBC  [place] heparin   INFECTIOUS A:   CAP  P:   BCx2 10/23>>> resp viral panel 10/24>>> Abx: azithro 10/23>>> Abx: rocephin 10/23>>> Urine strep ag Legionella >>  ENDOCRINE A:   Hyperglycemia  P:   ssi protocol   NEUROLOGIC A:   Acute encephalopathy  > h/o ETOH  P:   RASS goal: n/a CIWA protocol     FAMILY  - Updates: wife & din law at bedside  - Inter-disciplinary family meet or Palliative Care meeting due by:  10/31    TODAY'S SUMMARY: will move to the ICU. Cont BIPAP. High risk of intubation  especially since his withdrawal is complicating his WOB. Benzos ok for ETOH withdrawal, may need precedex if worse  I have  personally obtained a history, examined the patient, evaluated laboratory and imaging results, formulated the assessment and plan and placed orders. CRITICAL CARE: The patient is critically ill with multiple organ systems failure and requires high complexity decision making for assessment and support, frequent evaluation and titration of therapies, application of advanced monitoring technologies and extensive interpretation of multiple databases. Critical Care Time devoted to patient care services described in this note is 50 minutes.    Cyril Mourning MD. Tonny Bollman. [facility] Pager [phone] If no response call [phone]     08/03/2014, 8:58 AM

## 2014-08-03 NOTE — Progress Notes (Signed)
Called to room for respiratory distress, ABG drawn, placed patient on BIPAP 12/6 60% FI02.  Patient anxious, pulling at mask, asking to take mask off.

## 2014-08-03 NOTE — Progress Notes (Signed)
Subjective: [name] was placed on BiPAP since admission as he was having trouble maintaining proper oxygenation. He was on BiPAP during rounds with 99-100% oxygen sat. He said that he had some shortness of breath but denied chest pain. PCCM was called and NP note says he will transfer to ICU.  Objective: Vital signs in last 24 hours: Filed Vitals:   08/03/14 0650 08/03/14 0844 08/03/14 0850 08/03/14 0900  BP:   123/70   Pulse: 108  88   Temp:  97.8 F (36.6 C)    TempSrc:  Axillary    Resp: 31  25   Height:      Weight:      SpO2: 94% 99% 98% 99%   Weight change:   Intake/Output Summary (Last 24 hours) at 08/03/14 1018 Last data filed at 08/03/14 0900  Gross per 24 hour  Intake      0 ml  Output    300 ml  Net   -300 ml   Gen: A&O x 4, on BiPAP, thin HEENT: Atraumatic, PERRL, EOMI, sclerae anicteric Heart: Regular rate and rhythm, normal S1 S2, no murmurs, rubs, or gallops Lungs: Difficult to appreciate while on BiPAP, clear to auscultation bilaterally, respirations unlabored Abd: Soft, non-tender, non-distended, + bowel sounds, no hepatosplenomegaly Ext: 1+ b/l LE edema on    Lab Results: Basic Metabolic Panel:  Recent Labs Lab 08/14/2014 1712 08/03/14 0500  NA 140 137  K 4.1 3.8  CL 99 96  CO2 26 30  GLUCOSE 116* 127*  BUN 15 16  CREATININE 0.69 0.56  CALCIUM 8.0* 7.4*   Liver Function Tests:  Recent Labs Lab 2014/08/14 1712  AST 23  ALT 6  ALKPHOS 107  BILITOT 1.0  PROT 7.3  ALBUMIN 3.9   CBC:  Recent Labs Lab Aug 14, 2014 1712 08/03/14 0500  WBC 4.8 3.4*  HGB 16.5 14.4  HCT 49.7 42.7  MCV 103.3* 102.2*  PLT 68* 66*   Cardiac Enzymes:  Recent Labs Lab 08/14/14 2331 08/03/14 0500  TROPONINI <0.30 <0.30   BNP:  Recent Labs Lab Aug 14, 2014 1715  PROBNP 9080.0*   Thyroid Function Tests:  Recent Labs Lab August 14, 2014 2330  TSH 0.292*   Misc. Labs: ABG pH 7.25, pCO2 69, pO2 60, bicarb 29 on nasal cannula         PH 7.36, pCO2  54, pO2 98, bicarb 29 on BiPAP Strep pneumo antigen negative  Micro Results: Recent Results (from the past 240 hour(s))  MRSA PCR SCREENING     Status: Abnormal   Collection Time    08-14-14 10:57 PM      Result Value Ref Range Status   MRSA by PCR POSITIVE (*) NEGATIVE Final   Comment:            The GeneXpert MRSA Assay (FDA     approved for NASAL specimens     only), is one component of a     comprehensive MRSA colonization     surveillance program. It is not     intended to diagnose MRSA     infection nor to guide or     monitor treatment for     MRSA infections.     RESULT CALLED TO, READ BACK BY AND VERIFIED WITH:     HANCOCK,M RN [id number] AGT [id number] SKEEN,P   Studies/Results: Ct Head Wo Contrast  Aug 14, 2014   CLINICAL DATA:  History of fall at home.  EXAM: CT HEAD WITHOUT CONTRAST  TECHNIQUE: Contiguous axial  images were obtained from the base of the skull through the vertex without intravenous contrast.  COMPARISON:  Head CT 09/11/2013 and head CT 11/06/2010  FINDINGS: Stable mild cerebral volume loss. Stable chronic moderate chronic microvascular ischemic changes in the periventricular white matter bilaterally. Negative for intra-axial hemorrhage, extra-axial hemorrhage, mass effect, mass lesion, or hydrocephalus.  Visualized paranasal sinuses and mastoid air cells are clear. The skull is intact.  IMPRESSION: No acute intracranial abnormality.  Chronic microvascular ischemic changes.   Electronically Signed   By: Britta MccreedySusan  Turner M.D.   On: 07/20/2014 21:07   Dg Chest Port 1 View  08/03/2014   CLINICAL DATA:  Dyspnea.  History of COPD and hypertension.  EXAM: PORTABLE CHEST - 1 VIEW  COMPARISON:  10/02/2014.  FINDINGS: Lungs are hyperexpanded. There is interstitial thickening most evident in the mid and lower lungs. This is similar to the previous day's study. It is increased when compared to the exam dated 10/09/2013. Subtle area of airspace opacities suggested previously in the left  mid lung is not currently evident. There are no areas of lung consolidation. No pleural effusion or pneumothorax.  Cardiac silhouette is normal in size. There is prominence of the pulmonary arteries, stable. No evidence of a mediastinal or hilar mass.  IMPRESSION: 1. No convincing pneumonia. Interstitial thickening appears increased when compared to a chest radiograph for 1 year previously. The possibility of interstitial edema or a diffuse interstitial infiltrate should be considered. 2. COPD.   Electronically Signed   By: Amie Portlandavid  Ormond M.D.   On: 08/03/2014 07:31   Dg Chest Port 1 View  07/30/2014   CLINICAL DATA:  Short of breath for 1 day.  History of COPD.  EXAM: PORTABLE CHEST - 1 VIEW  COMPARISON:  08/05/2014 at 1448 hr  FINDINGS: Cardiac silhouette is normal in size. Mild prominence of the pulmonary arteries. No mediastinal or hilar masses.  Lungs are hyperexpanded. There is mild interstitial thickening in the lung bases. The hazy opacity noted in the left mid lung on the earlier study is less apparent likely due to differences in patient positioning and technique. Pneumonia/infiltrate remains possible. A paucity of lung markings in the upper lobes is consistent with emphysema.  No pleural effusion or pneumothorax.  Bony thorax is demineralized. An old healed rib fractures noted on the left.  IMPRESSION: 1. COPD/emphysema. 2. Possible subtle left lower lobe infiltrate as described on the earlier exam. 3. No other evidence of acute cardiopulmonary disease.   Electronically Signed   By: Amie Portlandavid  Ormond M.D.   On: 08/01/2014 17:47   Medications: I have reviewed the patient's current medications. Scheduled Meds: . antiseptic oral rinse  7 mL Mouth Rinse BID  . aspirin EC  325 mg Oral Daily  . azithromycin  500 mg Intravenous Q24H  . cefTRIAXone (ROCEPHIN)  IV  1 g Intravenous Q24H  . Chlorhexidine Gluconate Cloth  6 each Topical Q0600  . clonazePAM  0.5 mg Oral QHS  . finasteride  5 mg Oral Daily  .  folic acid  0.5 mg Oral Daily  . furosemide      . gabapentin  400 mg Oral BID  . heparin  5,000 Units Subcutaneous 3 times per day  . ipratropium-albuterol  3 mL Nebulization Q4H  . methylPREDNISolone (SOLU-MEDROL) injection  125 mg Intravenous Daily  . mometasone-formoterol  2 puff Inhalation BID  . multivitamin with minerals  1 tablet Oral Daily  . mupirocin ointment  1 application Nasal BID  . nicotine  14 mg Transdermal Daily  . pantoprazole  40 mg Oral Daily  . phenytoin  300 mg Oral QHS  . sodium chloride  3 mL Intravenous Q12H  . tamsulosin  0.4 mg Oral Daily  . thiamine  100 mg Oral Daily   Or  . thiamine  100 mg Intravenous Daily   Continuous Infusions: . sodium chloride 75 mL/hr at April 16, 2014 2336   PRN Meds:.chlorpheniramine-HYDROcodone, ipratropium-albuterol, LORazepam, LORazepam, morphine injection Assessment/Plan: Principal Problem:   CAP (community acquired pneumonia) Active Problems:   COPD (chronic obstructive pulmonary disease)   Tobacco abuse   ETOH abuse   HLD (hyperlipidemia)   GERD (gastroesophageal reflux disease)   ST segment depression   Peripheral edema   Falls frequently   Peripheral neuropathy  Respiratory Acidosis 2/2 CAP v COPD Exacerbation v acute CHF: Patient is oxygenating well (99-100%) on BiPAP but PCCM was consulted and NP note says he will transfer to ICU. ABG notes respiratory acidosis which has improved since transition from nasal cannula to BiPAP. Initially team was concerned about CAP v COPD exacerbation given subtle LLL infiltrate on CXR but now that is read as interstitial edema or a difuse interstitial infiltrate. Also proBNP is >9000 and he has b/l 1+ pitting edema in ankles. No known prior history of CHF. Initial EKG concerning for ST depression in inferior leads and he had some chest pain in the middle of the night but troponins negative x 2. Strep pneumo antigen negative. -confirm transfer to PCCM - Repeat EKG   -Trend troponins    -lasix 20 mg iv once for now and reassess -2D Echo -Continue Azithromycin 500mg  daily for 7 days total abx course  -Continue Rocephin 1g daily for 7 days total abx course  -methylprednisolone 125 mg iv daily -Duoneb 3mL Q4hr  -Continue home Advair as Dulera 100-665mcg 2 puff BID  -Sputum Cx  -f/u legionella antigen, RSV panel  Falls: pt reports imbalance 2/2 peripheral neuropathy. Uses cane to ambulate. Head CT without acute intracranial abnormality. TSH of 0.29 w free T4 in process -PT/OT  -f/u free T4 and reassess  Thrombocytopenia: AST/ALT wnl making liver disease unlikely. Likely 2/2 alcohol use. No past CBC for comparison  -Continue to monitor  -f/u HIV antibody   Alcohol use:  -CIWA protocol   HTN  -Continue home ASA 325mg  daily   GERD  -40mg  pantroprazole   Seizures  -Continue home Klonopin 0.5 QHS  -Continue home phenytoin 300mg  QHS   Peripheral neuropathy  -Decrease home dose gabapentin to 400mg  BID   BPH  -continue home finasteride 5mg  daily  -continue home tansulosin 0.4mg  daily   FEN:  -NPO  -NS@75cc /hr  -folic acid 0.5mg  daily  -MVI daily  -vitamin B1 100mg  daily   DVT ppx: subq heparin 5000u TID  Dispo: Disposition is deferred at this time, awaiting improvement of current medical problems.  Anticipated discharge in approximately 2-3 day(s).   The patient does have a current PCP (Toma DeitersXaje A Hasanaj, MD) and [facility] after discharge.  The patient does not know have transportation limitations that hinder transportation to clinic appointments.  .Services Needed at time of discharge: Y = Yes, Blank = No PT:   OT:   RN:   Equipment:   Other:     LOS: 1 day   Lorenda HatchetAdam L Estell Dillinger, MD 08/03/2014, 10:18 AM

## 2014-08-03 NOTE — Progress Notes (Signed)
PT Cancellation Note  Patient Details Name: Kenneth Bass MRN: [id number] DOB: 1951-07-02   Cancelled Treatment:    Reason Eval/Treat Not Completed: Medical issues which prohibited therapy.  Pt with medical decline.  MD discontinued PT.  Will sign off and please reorder if status changes.  Thanks.   Tawni MillersWhite, [name] F 08/03/2014, 12:02 PM [employer] Bradee Common,PT Acute Rehabilitation [phone] (pager)

## 2014-08-03 NOTE — Progress Notes (Addendum)
Pt was doing well overnight with O2 saturation around 91-93% on 3L nasal canula. However, once he was placed on the bed pan this morning, around 5:30am, he began to desaturate into the low 80s. His O2 was increased to 5L [place] and hsi O2 saturation would improve to upper 80s to low 90s briefly, but then he would again desaturate with any movement and with difficulty getting his O2 to improve to the upper 80s to low 90s.    On exam he has mild scattered wheezing with rhonchi but no appreciable crackles. Lung exam limited by dry coughing from the patient.   An ABG was obtained: 7.25/69/60/29/85%. Bipap was started and PCCM was consulted.  - Will repeat ABG in 1 hour after starting bipap. - CXR pending - PCCM to see the patient this morning

## 2014-08-03 NOTE — Progress Notes (Signed)
RT removed patient from BIPAP. Placed on 55% venti-mask. Patient tolerating well at this time. No complications. Vital signs stable at this time. RN aware. RT will continue to monitor.

## 2014-08-03 NOTE — Progress Notes (Signed)
Patient transported to 2112 from 3S12, placed on NRB for transport NP aware. placed back on Bipap as soon as patient arrived to 2112, patient SATS remained at 100% during the 10 minutes of transport RR 25 BPM, HR at 85 BPM, patient tolerated transport well.

## 2014-08-03 NOTE — Progress Notes (Signed)
  Echocardiogram 2D Echocardiogram has been performed.  Arvil ChacoFoster, Bernardo Brayman 08/03/2014, 12:15 PM

## 2014-08-03 NOTE — Progress Notes (Signed)
Peak Flow- 90  Patient weak and coughing.

## 2014-08-03 NOTE — Progress Notes (Addendum)
INITIAL NUTRITION ASSESSMENT  DOCUMENTATION CODES Per approved criteria  -Not Applicable   INTERVENTION:  If TF warranted, recommend Vital AF 1.2 formula -- initiate at 20 ml/hr and increase by 10 ml every 8 hours to goal rate of 50 ml/hr to provide 1440 kcals, 90 gm protein, 973 ml of free water RD to follow for nutrition care plan  NUTRITION DIAGNOSIS: Inadequate oral intake related to inability to eat as evidenced by NPO status  Goal: Pt to meet >/= 90% of their estimated nutrition needs   Monitor:  TF initiation & regimen, PO diet advancement, weight, labs, I/O's  Reason for Assessment: Malnutrition Screening Tool Report  63 y.o. male  Admitting Dx: CAP (community acquired pneumonia)  ASSESSMENT: [age] yom smoker with PMH of COPD, ETOH and benzo dep anxiety. Admitted 10/23 w/dx CAP and AECOPD.   Patient developed worsening resp failure and delirium/agitation.  Transferred to MICU from 3S-Stepdown 10/24.    RD unable to obtain nutrition hx.  Pt currently on BiPAP.  Per Malnutrition Screening Tool, pt has been eating poorly because of a decreased appetite.  Pt on borderline of meeting criteria for underweight status.  Question whether nutrition support will be warranted.  Noted pt also with hx of ETOH use; on Alcohol Withdrawal Prevention Protocol.  RD unable to complete Nutrition Focused Physical Exam at this time.  RD suspects possible malnutrition.  Height: Ht Readings from Last 1 Encounters:  08/03/14 5\' 6"  (1.676 m)    Weight: Wt Readings from Last 1 Encounters:  08/03/14 116 lb 13.5 oz (53 kg)    Ideal Body Weight: 130 lb  % Ideal Body Weight: 89%  Wt Readings from Last 10 Encounters:  08/03/14 116 lb 13.5 oz (53 kg)  08/14/12 126 lb (57.153 kg)    Usual Body Weight: unable to obtain  % Usual Body Weight: ---  BMI:  Body mass index is 18.87 kg/(m^2).  Estimated Nutritional Needs: Kcal: 1400-1600 Protein: 80-90 gm Fluid: 1.5-1.7 L  Skin:  Intact  Diet Order: NPO  EDUCATION NEEDS: -No education needs identified at this time   Intake/Output Summary (Last 24 hours) at 08/03/14 1212 Last data filed at 08/03/14 1043  Gross per 24 hour  Intake      0 ml  Output   1300 ml  Net  -1300 ml    Labs:   Recent Labs Lab 2013-10-13 1712 08/03/14 0500  NA 140 137  K 4.1 3.8  CL 99 96  CO2 26 30  BUN 15 16  CREATININE 0.69 0.56  CALCIUM 8.0* 7.4*  GLUCOSE 116* 127*    CBG (last 3)   Recent Labs  08/03/14 1107  GLUCAP 101*    Scheduled Meds: . antiseptic oral rinse  7 mL Mouth Rinse BID  . aspirin EC  325 mg Oral Daily  . azithromycin  500 mg Intravenous Q24H  . cefTRIAXone (ROCEPHIN)  IV  1 g Intravenous Q24H  . Chlorhexidine Gluconate Cloth  6 each Topical Q0600  . clonazePAM  0.5 mg Oral QHS  . finasteride  5 mg Oral Daily  . folic acid  0.5 mg Oral Daily  . furosemide      . gabapentin  400 mg Oral BID  . heparin  5,000 Units Subcutaneous 3 times per day  . ipratropium-albuterol  3 mL Nebulization Q4H  . methylPREDNISolone (SOLU-MEDROL) injection  60 mg Intravenous Q6H  . multivitamin with minerals  1 tablet Oral Daily  . mupirocin ointment  1 application Nasal  BID  . nicotine  14 mg Transdermal Daily  . pantoprazole  40 mg Oral Daily  . phenytoin  300 mg Oral QHS  . sodium chloride  3 mL Intravenous Q12H  . tamsulosin  0.4 mg Oral Daily  . thiamine  100 mg Oral Daily   Or  . thiamine  100 mg Intravenous Daily    Continuous Infusions: . sodium chloride      Past Medical History  Diagnosis Date  . Neuromyotonia neuropathy  . COPD (chronic obstructive pulmonary disease)   . Hypertension   . Seizures 2 in past.  2012  . Peripheral vascular disease   . GERD (gastroesophageal reflux disease)     Past Surgical History  Procedure Laterality Date  . Finger fracture surgery  1980's  . Colonoscopy  x2  3 yrs ago  . Esophagogastroduodenoscopy endoscopy  x4  last one 10 yrs ago    with  dilation  . Vascular surgery      bilateral legs   . Fracture surgery    . Cataract extraction w/phaco  08/21/2012    Procedure: CATARACT EXTRACTION PHACO AND INTRAOCULAR LENS PLACEMENT (IOC);  Surgeon: Gemma PayorKerry Hunt, MD;  Location: [facility];  Service: Ophthalmology;  Laterality: Right;  CDE=12.47  . Cataract extraction w/phaco  09/18/2012    Procedure: CATARACT EXTRACTION PHACO AND INTRAOCULAR LENS PLACEMENT (IOC);  Surgeon: Gemma PayorKerry Hunt, MD;  Location: [facility];  Service: Ophthalmology;  Laterality: Left;  CDE 10.77    Maureen ChattersKatie Areil Ottey, RD, LDN Pager #: [phone] After-Hours Pager #: [phone]

## 2014-08-04 ENCOUNTER — Inpatient Hospital Stay (HOSPITAL_COMMUNITY): Payer: Medicare Other

## 2014-08-04 DIAGNOSIS — J441 Chronic obstructive pulmonary disease with (acute) exacerbation: Secondary | ICD-10-CM

## 2014-08-04 DIAGNOSIS — J9601 Acute respiratory failure with hypoxia: Secondary | ICD-10-CM

## 2014-08-04 DIAGNOSIS — J189 Pneumonia, unspecified organism: Secondary | ICD-10-CM

## 2014-08-04 DIAGNOSIS — F10231 Alcohol dependence with withdrawal delirium: Secondary | ICD-10-CM

## 2014-08-04 LAB — POCT I-STAT 3, ART BLOOD GAS (G3+)
Acid-Base Excess: 1 mmol/L (ref 0.0–2.0)
Acid-Base Excess: 1 mmol/L (ref 0.0–2.0)
Bicarbonate: 33.2 mEq/L — ABNORMAL HIGH (ref 20.0–24.0)
Bicarbonate: 29.7 mEq/L — ABNORMAL HIGH (ref 20.0–24.0)
O2 SAT: 100 %
O2 Saturation: 98 %
PCO2 ART: 61.6 mmHg — AB (ref 35.0–45.0)
PO2 ART: 110 mmHg — AB (ref 80.0–100.0)
Patient temperature: 97.9
TCO2: 36 mmol/L (ref 0–100)
TCO2: 32 mmol/L (ref 0–100)
pCO2 arterial: 84.9 mmHg (ref 35.0–45.0)
pH, Arterial: 7.197 — CL (ref 7.350–7.450)
pH, Arterial: 7.29 — ABNORMAL LOW (ref 7.350–7.450)
pO2, Arterial: 295 mmHg — ABNORMAL HIGH (ref 80.0–100.0)

## 2014-08-04 LAB — BASIC METABOLIC PANEL
Anion gap: 17 — ABNORMAL HIGH (ref 5–15)
BUN: 19 mg/dL (ref 6–23)
CHLORIDE: 91 meq/L — AB (ref 96–112)
CO2: 26 meq/L (ref 19–32)
Calcium: 6.7 mg/dL — ABNORMAL LOW (ref 8.4–10.5)
Creatinine, Ser: 0.62 mg/dL (ref 0.50–1.35)
GFR calc Af Amer: >90 mL/min (ref >90)
GFR calc non Af Amer: >90 mL/min (ref >90)
Glucose, Bld: 124 mg/dL — ABNORMAL HIGH (ref 70–99)
POTASSIUM: 4.3 meq/L (ref 3.7–5.3)
Sodium: 134 mEq/L — ABNORMAL LOW (ref 137–147)

## 2014-08-04 LAB — TRIGLYCERIDES: Triglycerides: 96 mg/dL (ref <150)

## 2014-08-04 LAB — LEGIONELLA ANTIGEN, URINE

## 2014-08-04 LAB — MAGNESIUM
MAGNESIUM: 1.2 mg/dL — AB (ref 1.5–2.5)
MAGNESIUM: 1.2 mg/dL — AB (ref 1.5–2.5)

## 2014-08-04 LAB — GLUCOSE, CAPILLARY
Glucose-Capillary: 122 mg/dL — ABNORMAL HIGH (ref 70–99)
Glucose-Capillary: 148 mg/dL — ABNORMAL HIGH (ref 70–99)

## 2014-08-04 LAB — PHOSPHORUS
PHOSPHORUS: 4.9 mg/dL — AB (ref 2.3–4.6)
Phosphorus: 1.8 mg/dL — ABNORMAL LOW (ref 2.3–4.6)

## 2014-08-04 MED ORDER — PRO-STAT SUGAR FREE PO LIQD
30.0000 mL | Freq: Two times a day (BID) | ORAL | Status: DC
  Administered 2014-08-04 – 2014-08-06 (×4): 30 mL
  Filled 2014-08-04 (×6): qty 30

## 2014-08-04 MED ORDER — CLONAZEPAM 0.5 MG PO TABS
0.5000 mg | ORAL_TABLET | Freq: Every day | ORAL | Status: DC
  Administered 2014-08-04: 0.5 mg
  Filled 2014-08-04: qty 1

## 2014-08-04 MED ORDER — PHENYLEPHRINE HCL 10 MG/ML IJ SOLN
30.0000 ug/min | INTRAVENOUS | Status: DC
  Administered 2014-08-04: 40 ug/min via INTRAVENOUS
  Administered 2014-08-04: 35 ug/min via INTRAVENOUS
  Administered 2014-08-04: 50 ug/min via INTRAVENOUS
  Administered 2014-08-05: 30 ug/min via INTRAVENOUS
  Administered 2014-08-05: 15 ug/min via INTRAVENOUS
  Administered 2014-08-05: 30 ug/min via INTRAVENOUS
  Administered 2014-08-05: 35 ug/min via INTRAVENOUS
  Filled 2014-08-04 (×7): qty 1

## 2014-08-04 MED ORDER — FENTANYL CITRATE 0.05 MG/ML IJ SOLN: 50.0000 ug | Freq: Once | INTRAMUSCULAR | Status: AC

## 2014-08-04 MED ORDER — FENTANYL CITRATE 0.05 MG/ML IJ SOLN
INTRAMUSCULAR | Status: AC
  Administered 2014-08-04: 100 ug
  Filled 2014-08-04: qty 2

## 2014-08-04 MED ORDER — FENTANYL CITRATE 0.05 MG/ML IJ SOLN: 100.0000 ug | Freq: Once | INTRAMUSCULAR | Status: DC

## 2014-08-04 MED ORDER — FENTANYL CITRATE 0.05 MG/ML IJ SOLN
50.0000 ug | INTRAMUSCULAR | Status: DC | PRN
  Filled 2014-08-04: qty 2

## 2014-08-04 MED ORDER — PANTOPRAZOLE SODIUM 40 MG PO PACK
40.0000 mg | PACK | Freq: Every day | ORAL | Status: DC
  Administered 2014-08-04 – 2014-08-21 (×18): 40 mg
  Filled 2014-08-04 (×18): qty 20

## 2014-08-04 MED ORDER — ASPIRIN 325 MG PO TABS
325.0000 mg | ORAL_TABLET | Freq: Every day | ORAL | Status: DC
  Administered 2014-08-04 – 2014-08-21 (×18): 325 mg
  Filled 2014-08-04 (×18): qty 1

## 2014-08-04 MED ORDER — ETOMIDATE 2 MG/ML IV SOLN: 0.3000 mg/kg | Freq: Once | INTRAVENOUS | Status: AC

## 2014-08-04 MED ORDER — PROPOFOL 10 MG/ML IV EMUL
0.0000 ug/kg/min | INTRAVENOUS | Status: DC
  Administered 2014-08-04: 30 ug/kg/min via INTRAVENOUS
  Administered 2014-08-04: 5 ug/kg/min via INTRAVENOUS
  Administered 2014-08-05: 25 ug/kg/min via INTRAVENOUS
  Administered 2014-08-05: 25.123 ug/kg/min via INTRAVENOUS
  Administered 2014-08-06: 35 ug/kg/min via INTRAVENOUS
  Administered 2014-08-07: 50 ug/kg/min via INTRAVENOUS
  Administered 2014-08-07: 25 ug/kg/min via INTRAVENOUS
  Administered 2014-08-07: 40 ug/kg/min via INTRAVENOUS
  Administered 2014-08-08: 15 ug/kg/min via INTRAVENOUS
  Administered 2014-08-08: 40 ug/kg/min via INTRAVENOUS
  Administered 2014-08-09 (×2): 20 ug/kg/min via INTRAVENOUS
  Administered 2014-08-09: 25 ug/kg/min via INTRAVENOUS
  Administered 2014-08-10: 30 ug/kg/min via INTRAVENOUS
  Administered 2014-08-10: 45 ug/kg/min via INTRAVENOUS
  Administered 2014-08-10: 40 ug/kg/min via INTRAVENOUS
  Administered 2014-08-11: 45 ug/kg/min via INTRAVENOUS
  Administered 2014-08-11 (×2): 50 ug/kg/min via INTRAVENOUS
  Administered 2014-08-11: 40 ug/kg/min via INTRAVENOUS
  Administered 2014-08-12: 45 ug/kg/min via INTRAVENOUS
  Administered 2014-08-12: 40 ug/kg/min via INTRAVENOUS
  Filled 2014-08-04 (×25): qty 100

## 2014-08-04 MED ORDER — LORAZEPAM 2 MG/ML IJ SOLN
4.0000 mg | Freq: Once | INTRAMUSCULAR | Status: AC
  Administered 2014-08-04: 4 mg via INTRAVENOUS

## 2014-08-04 MED ORDER — VITAL HIGH PROTEIN PO LIQD
1000.0000 mL | ORAL | Status: DC
  Administered 2014-08-04: 1000 mL
  Administered 2014-08-05: 06:00:00
  Administered 2014-08-06: 1000 mL
  Filled 2014-08-04 (×4): qty 1000

## 2014-08-04 MED ORDER — MIDAZOLAM HCL 2 MG/2ML IJ SOLN
INTRAMUSCULAR | Status: AC
  Administered 2014-08-04: 2 mg
  Filled 2014-08-04: qty 2

## 2014-08-04 MED ORDER — FENTANYL CITRATE 0.05 MG/ML IJ SOLN
100.0000 ug | Freq: Once | INTRAMUSCULAR | Status: AC
  Administered 2014-08-04: 100 ug via INTRAVENOUS

## 2014-08-04 MED ORDER — SODIUM CHLORIDE 0.9 % IV SOLN
0.0000 ug/h | INTRAVENOUS | Status: DC
  Administered 2014-08-04: 50 ug/h via INTRAVENOUS
  Administered 2014-08-06: 100 ug/h via INTRAVENOUS
  Administered 2014-08-07: 200 ug/h via INTRAVENOUS
  Administered 2014-08-07: 300 ug/h via INTRAVENOUS
  Administered 2014-08-08: 200 ug/h via INTRAVENOUS
  Administered 2014-08-09: 150 ug/h via INTRAVENOUS
  Administered 2014-08-09: 100 ug/h via INTRAVENOUS
  Administered 2014-08-10: 225 ug/h via INTRAVENOUS
  Administered 2014-08-11: 350 ug/h via INTRAVENOUS
  Administered 2014-08-11: 400 ug/h via INTRAVENOUS
  Administered 2014-08-12: 300 ug/h via INTRAVENOUS
  Administered 2014-08-12: 250 ug/h via INTRAVENOUS
  Administered 2014-08-12: 300 ug/h via INTRAVENOUS
  Administered 2014-08-13 (×2): 400 ug/h via INTRAVENOUS
  Administered 2014-08-14: 300 ug/h via INTRAVENOUS
  Filled 2014-08-04 (×18): qty 50

## 2014-08-04 MED ORDER — ASPIRIN 325 MG PO TABS
325.0000 mg | ORAL_TABLET | Freq: Every day | ORAL | Status: DC
  Filled 2014-08-04: qty 1

## 2014-08-04 MED ORDER — FOLIC ACID 0.5 MG HALF TAB
0.5000 mg | ORAL_TABLET | Freq: Every day | ORAL | Status: DC
  Administered 2014-08-05 – 2014-08-07 (×3): 0.5 mg
  Filled 2014-08-04 (×3): qty 1

## 2014-08-04 MED ORDER — FENTANYL BOLUS VIA INFUSION
25.0000 ug | INTRAVENOUS | Status: DC | PRN
  Administered 2014-08-07 – 2014-08-14 (×13): 50 ug via INTRAVENOUS
  Filled 2014-08-04: qty 50

## 2014-08-04 MED ORDER — PHENYTOIN 125 MG/5ML PO SUSP
100.0000 mg | Freq: Three times a day (TID) | ORAL | Status: DC
  Administered 2014-08-04 – 2014-08-06 (×7): 100 mg via ORAL
  Filled 2014-08-04 (×9): qty 4

## 2014-08-04 MED ORDER — ETOMIDATE 2 MG/ML IV SOLN
INTRAVENOUS | Status: AC
  Administered 2014-08-04: 20 mg
  Filled 2014-08-04: qty 10

## 2014-08-04 MED ORDER — DEXMEDETOMIDINE HCL IN NACL 200 MCG/50ML IV SOLN
0.0000 ug/kg/h | INTRAVENOUS | Status: DC
  Administered 2014-08-04: 0.1 ug/kg/h via INTRAVENOUS
  Filled 2014-08-04: qty 50

## 2014-08-04 NOTE — Progress Notes (Signed)
Pt woke up scared and anxious, pt with increase in WOB with decrease in sats. Pt placed back on 55% VM.

## 2014-08-04 NOTE — Procedures (Signed)
Intubation Procedure Note Kenneth GullyFrancis J Bass [id number] 01/26/1951  Procedure: Intubation Indications: Respiratory insufficiency  Procedure Details Consent: Unable to obtain consent because of emergent medical necessity. Time Out: Verified patient identification, verified procedure, site/side was marked, verified correct patient position, special equipment/implants available, medications/allergies/relevent history reviewed, required imaging and test results available.  Performed  Maximum sterile technique was used including gloves, gown, hand hygiene and mask.  MAC and 3  2mg  versed, 100 mcg fent, 20 mg etomidate  Evaluation Hemodynamic Status: BP stable throughout; O2 sats: stable throughout Patient's Current Condition: stable Complications: No apparent complications Patient did tolerate procedure well. Chest X-ray ordered to verify placement.  CXR: pending.   ALVA,RAKESH V. 08/04/2014

## 2014-08-04 NOTE — Progress Notes (Signed)
Panic ABG results reported [facility] Bab[facility] PA 7.197 C02 84.9 [id number]  HC03 33.2

## 2014-08-04 NOTE — Procedures (Signed)
Central Venous Catheter Insertion Procedure Note Kenneth GullyFrancis J Bass [id number] [birth year]  Procedure: Insertion of Central Venous Catheter Indications: Assessment of intravascular volume, Drug and/or fluid administration and Frequent blood sampling  Procedure Details Consent: Risks of procedure as well as the alternatives and risks of each were explained to the (patient/caregiver).  Consent for procedure obtained. Time Out: Verified patient identification, verified procedure, site/side was marked, verified correct patient position, special equipment/implants available, medications/allergies/relevent history reviewed, required imaging and test results available.  Performed Real time US was used to ID and cannulate the vessel  Maximum sterile technique was used including antiseptics, cap, gloves, gown, hand hygiene, mask and sheet. Skin prep: Chlorhexidine; local anesthetic administered A antimicrobial bonded/coated triple lumen catheter was placed in the right internal jugular vein using the Seldinger technique.  Evaluation Blood flow good Complications: No apparent complications Patient did tolerate procedure well. Chest X-ray ordered to verify placement.  CXR: pending.  BABCOCK,PETE 08/04/2014, 12:25 PM  Ultrasound used for site verification, live visualisation of needle entry & guidewire prior to dilation  Oretha MilchALVA,RAKESH V. MD

## 2014-08-04 NOTE — Progress Notes (Signed)
RN called for a breathing treatment stating that the patient was in respiratory distress. Pt was desating into the 70's & 80's & very SOB, stating he could not get enough air. RN gave ativan & I placed pt on bipap with no releif. Dr Vassie LollAlva called to evaluate pt & decided to intubate. Vitals as noted

## 2014-08-04 NOTE — Progress Notes (Signed)
PULMONARY / CRITICAL CARE MEDICINE   Name: Kenneth Bass MRN: [id number] DOB: 1950/11/22    ADMISSION DATE:  07/28/2014 CONSULTATION DATE:  10/24  REFERRING MD :  Criselda PeachesMullen   CHIEF COMPLAINT:  Acute on chronic respiratory failure   INITIAL PRESENTATION:  [age] yom smoker w/ sig h/o COPD, ETOH and benzo dep anxiety. Admitted 10/23 w/ working dx CAP and AECOPD. Developed worsening resp failure and delirium/agitation on 10/24 PCCM asked to assess.   STUDIES:    SIGNIFICANT EVENTS: 10/23: admitted 10/24: progressive resp failure, placed on BIPAP. Transferred to ICU  10/25: severe anxiety/ agitation. Desaturated and did not improve w/ BIPAP. Required emergent intubation.   SUBJECTIVE:  Sedated on vent  Afebrile Seemed OK on venti mask early am but developed sudden resp distress with no air movement, did not subside with bipap/ativan,& required intubation  VITAL SIGNS: Temp:  [97.2 F (36.2 C)-98.5 F (36.9 C)] 97.6 F (36.4 C) (10/25 0713) Pulse Rate:  [81-113] 97 (10/25 0930) Resp:  [12-26] 18 (10/25 0930) BP: (111-164)/(70-109) 114/74 mmHg (10/25 0930) SpO2:  [93 %-100 %] 100 % (10/25 0930) FiO2 (%):  [55 %-100 %] 100 % (10/25 0900) Weight:  [53 kg (116 lb 13.5 oz)-54.4 kg (119 lb 14.9 oz)] 54.4 kg (119 lb 14.9 oz) (10/25 0500) HEMODYNAMICS:   VENTILATOR SETTINGS: Vent Mode:  [-] PRVC FiO2 (%):  [55 %-100 %] 100 % Set Rate:  [20 bmp] 20 bmp Vt Set:  [400 mL] 400 mL PEEP:  [5 cmH20] 5 cmH20 Plateau Pressure:  [14 cmH20] 14 cmH20 INTAKE / OUTPUT:  Intake/Output Summary (Last 24 hours) at 08/04/14 0953 Last data filed at 08/04/14 0800  Gross per 24 hour  Intake   2250 ml  Output   2600 ml  Net   -350 ml    PHYSICAL EXAMINATION: General:  Chronically ill appearing 63 yo male, appears older than stated age. Sedated on vent  Neuro:  Now sedated. Gets very agitated. Shaking and restless.  HEENT:  Orally intubated  Cardiovascular:  rrr Lungs:  Very prolonged  expiratory wheeze  Abdomen:  Soft, + bowel sounds  Musculoskeletal:  Intact  Skin:  Intact warm, brisk CR   LABS:  CBC  Recent Labs Lab 08/05/2014 1712 08/03/14 0500  WBC 4.8 3.4*  HGB 16.5 14.4  HCT 49.7 42.7  PLT 68* 66*   Coag's No results found for this basename: APTT, INR,  in the last 168 hours BMET  Recent Labs Lab 07/18/2014 1712 08/03/14 0500  NA 140 137  K 4.1 3.8  CL 99 96  CO2 26 30  BUN 15 16  CREATININE 0.69 0.56  GLUCOSE 116* 127*   Electrolytes  Recent Labs Lab 08/01/2014 1712 08/03/14 0500  CALCIUM 8.0* 7.4*   Sepsis Markers No results found for this basename: LATICACIDVEN, PROCALCITON, O2SATVEN,  in the last 168 hours ABG  Recent Labs Lab 08/03/14 0617 08/03/14 0855  PHART 7.254* 7.355  PCO2ART 69.1* 54.0*  PO2ART 60.4* 98.0   Liver Enzymes  Recent Labs Lab 08/04/2014 1712  AST 23  ALT 6  ALKPHOS 107  BILITOT 1.0  ALBUMIN 3.9   Cardiac Enzymes  Recent Labs Lab 07/14/2014 1715 08/10/2014 2331 08/03/14 0500  TROPONINI  --  <0.30 <0.30  PROBNP 9080.0*  --   --    Glucose  Recent Labs Lab 08/03/14 1107  GLUCAP 101*    Imaging Dg Chest Port 1 View  08/03/2014   CLINICAL DATA:  Dyspnea.  History of  COPD and hypertension.  EXAM: PORTABLE CHEST - 1 VIEW  COMPARISON:  10/02/2014.  FINDINGS: Lungs are hyperexpanded. There is interstitial thickening most evident in the mid and lower lungs. This is similar to the previous day's study. It is increased when compared to the exam dated 10/09/2013. Subtle area of airspace opacities suggested previously in the left mid lung is not currently evident. There are no areas of lung consolidation. No pleural effusion or pneumothorax.  Cardiac silhouette is normal in size. There is prominence of the pulmonary arteries, stable. No evidence of a mediastinal or hilar mass.  IMPRESSION: 1. No convincing pneumonia. Interstitial thickening appears increased when compared to a chest radiograph for 1 year  previously. The possibility of interstitial edema or a diffuse interstitial infiltrate should be considered. 2. COPD.   Electronically Signed   By: Amie Portlandavid  Ormond M.D.   On: 08/03/2014 07:31  ETT good position. Worsening R>L airspace disease.   ASSESSMENT / PLAN:  PULMONARY OETT 10/25>>> A: Acute hypoxic and hypercarbic respiratory failure  PNA  AECOPD  CXR w/ progressive volume loss in right base. Required intubation today more as a result of a panic attack superimposed on prob ETOH w/d than significant worsening CXR.   P:   Full vent support Scheduled nebs Solumedrol 60 q6 Keep RR 20 to avoid autoPEEP   CARDIOVASCULAR CVL A: SIRS/sepsis  P:  Tele  IV hydration  Ck lactic acid   RENAL A:   No acute issues  P:   IV hydration  Serial chems   GASTROINTESTINAL A:   Aspiration risk  P:   NPO  PPI  Start tubefeed protocol   HEMATOLOGIC A:   Mild thrombocytopenia   P:  Trend CBC  [place] heparin   INFECTIOUS A:   CAP  P:   BCx2 10/23>>> resp viral panel 10/24>>> Abx: azithro 10/23>>> Abx: rocephin 10/23>>> Urine strep ag neg Legionella >>neg mrsa PCR: 10/24: positive   ENDOCRINE A:   Hyperglycemia  P:   ssi protocol   NEUROLOGIC A:   Acute encephalopathy  Panic attack DTs  P:   RASS goal: -3 Add thiamine and folate precedex gtt -->if fails will use diprovan  Cont PRN benzos and fentanyl     FAMILY  - Updates: wife & din law at bedside 10/25  - Inter-disciplinary family meet or Palliative Care meeting due by:  10/31    TODAY'S SUMMARY: Sudden resp distress ? Panic attack vs bronchospasm requiring intubation The patient is critically ill with multiple organ systems failure and requires high complexity decision making for assessment and support, frequent evaluation and titration of therapies, application of advanced monitoring technologies and extensive interpretation of multiple databases. Critical Care Time devoted to patient care services  described in this note is 40 minutes.   Care during the described time interval was provided by me and/or other providers on the critical care team.  I have reviewed this patient's available data, including medical history, events of note, physical examination and test results as part of my evaluation  Oretha MilchALVA,Jarrel Knoke V. MD     08/04/2014, 9:53 AM

## 2014-08-04 NOTE — Procedures (Signed)
Arterial Catheter Insertion Procedure Note Avon GullyFrancis J Toran [id number] 30-Nov-1950  Procedure: Insertion of Arterial Catheter  Indications: Blood pressure monitoring and Frequent blood sampling  Procedure Details Consent: Risks of procedure as well as the alternatives and risks of each were explained to the (patient/caregiver).  Consent for procedure obtained. Time Out: Verified patient identification, verified procedure, site/side was marked, verified correct patient position, special equipment/implants available, medications/allergies/relevent history reviewed, required imaging and test results available.  Performed  Maximum sterile technique was used including antiseptics, cap, gloves, gown, hand hygiene, mask and sheet. Skin prep: Chlorhexidine; local anesthetic administered 20 gauge catheter was inserted into right radial artery using the Seldinger technique.  Evaluation Blood flow good; BP tracing good. Complications: No apparent complications.   Jennette KettleBrowning, Alando Colleran Joy 08/04/2014

## 2014-08-05 ENCOUNTER — Inpatient Hospital Stay (HOSPITAL_COMMUNITY): Payer: Medicare Other

## 2014-08-05 LAB — CBC
HEMATOCRIT: 44.1 % (ref 39.0–52.0)
Hemoglobin: 14.8 g/dL (ref 13.0–17.0)
MCH: 33.6 pg (ref 26.0–34.0)
MCHC: 33.6 g/dL (ref 30.0–36.0)
MCV: 100 fL (ref 78.0–100.0)
PLATELETS: 75 10*3/uL — AB (ref 150–400)
RBC: 4.41 MIL/uL (ref 4.22–5.81)
RDW: 15.7 % — AB (ref 11.5–15.5)
WBC: 8 10*3/uL (ref 4.0–10.5)

## 2014-08-05 LAB — BASIC METABOLIC PANEL
ANION GAP: 8 (ref 5–15)
BUN: 21 mg/dL (ref 6–23)
CO2: 29 meq/L (ref 19–32)
Calcium: 6.7 mg/dL — ABNORMAL LOW (ref 8.4–10.5)
Chloride: 95 mEq/L — ABNORMAL LOW (ref 96–112)
Creatinine, Ser: 0.57 mg/dL (ref 0.50–1.35)
GFR calc Af Amer: >90 mL/min (ref >90)
Glucose, Bld: 147 mg/dL — ABNORMAL HIGH (ref 70–99)
Potassium: 3.2 mEq/L — ABNORMAL LOW (ref 3.7–5.3)
SODIUM: 132 meq/L — AB (ref 137–147)

## 2014-08-05 LAB — PHOSPHORUS
PHOSPHORUS: 2 mg/dL — AB (ref 2.3–4.6)
Phosphorus: 2.2 mg/dL — ABNORMAL LOW (ref 2.3–4.6)

## 2014-08-05 LAB — MAGNESIUM
MAGNESIUM: 1.4 mg/dL — AB (ref 1.5–2.5)
Magnesium: 2.7 mg/dL — ABNORMAL HIGH (ref 1.5–2.5)

## 2014-08-05 LAB — GLUCOSE, CAPILLARY
Glucose-Capillary: 118 mg/dL — ABNORMAL HIGH (ref 70–99)
Glucose-Capillary: 138 mg/dL — ABNORMAL HIGH (ref 70–99)
Glucose-Capillary: 146 mg/dL — ABNORMAL HIGH (ref 70–99)
Glucose-Capillary: 163 mg/dL — ABNORMAL HIGH (ref 70–99)
Glucose-Capillary: 172 mg/dL — ABNORMAL HIGH (ref 70–99)
Glucose-Capillary: 199 mg/dL — ABNORMAL HIGH (ref 70–99)
Glucose-Capillary: 77 mg/dL (ref 70–99)

## 2014-08-05 LAB — PHENYTOIN LEVEL, TOTAL: Phenytoin Lvl: 20.7 ug/mL — ABNORMAL HIGH (ref 10.0–20.0)

## 2014-08-05 LAB — RESPIRATORY VIRUS PANEL
Adenovirus: NOT DETECTED
INFLUENZA A H1: NOT DETECTED
INFLUENZA A: NOT DETECTED
INFLUENZA B 1: NOT DETECTED
Influenza A H3: NOT DETECTED
Metapneumovirus: NOT DETECTED
PARAINFLUENZA 2 A: NOT DETECTED
PARAINFLUENZA 3 A: NOT DETECTED
Parainfluenza 1: NOT DETECTED
Respiratory Syncytial Virus A: NOT DETECTED
Respiratory Syncytial Virus B: NOT DETECTED
Rhinovirus: NOT DETECTED

## 2014-08-05 LAB — APTT: aPTT: 31 seconds (ref 24–37)

## 2014-08-05 LAB — PROTIME-INR
INR: 1.19 (ref 0.00–1.49)
Prothrombin Time: 15.2 seconds (ref 11.6–15.2)

## 2014-08-05 MED ORDER — LORAZEPAM 1 MG PO TABS: 1.0000 mg | ORAL_TABLET | Freq: Three times a day (TID) | ORAL | Status: DC

## 2014-08-05 MED ORDER — LORAZEPAM 2 MG/ML PO CONC: 1.0000 mg | Freq: Three times a day (TID) | ORAL | Status: DC

## 2014-08-05 MED ORDER — THIAMINE HCL 100 MG/ML IJ SOLN
250.0000 mg | Freq: Every day | INTRAMUSCULAR | Status: DC
  Administered 2014-08-05 – 2014-08-08 (×4): 250 mg via INTRAVENOUS
  Filled 2014-08-05 (×8): qty 2.5

## 2014-08-05 MED ORDER — MAGNESIUM SULFATE 4000MG/100ML IJ SOLN
4.0000 g | Freq: Once | INTRAMUSCULAR | Status: AC
  Administered 2014-08-05: 4 g via INTRAVENOUS
  Filled 2014-08-05: qty 100

## 2014-08-05 MED ORDER — INSULIN ASPART 100 UNIT/ML ~~LOC~~ SOLN
0.0000 [IU] | SUBCUTANEOUS | Status: DC
  Administered 2014-08-05 (×2): 2 [IU] via SUBCUTANEOUS
  Administered 2014-08-06 (×2): 1 [IU] via SUBCUTANEOUS
  Administered 2014-08-06 (×2): 2 [IU] via SUBCUTANEOUS
  Administered 2014-08-06 (×2): 1 [IU] via SUBCUTANEOUS
  Administered 2014-08-07: 3 [IU] via SUBCUTANEOUS
  Administered 2014-08-07: 2 [IU] via SUBCUTANEOUS
  Administered 2014-08-07: 1 [IU] via SUBCUTANEOUS
  Administered 2014-08-07: 2 [IU] via SUBCUTANEOUS
  Administered 2014-08-08 – 2014-08-09 (×7): 1 [IU] via SUBCUTANEOUS
  Administered 2014-08-09: 2 [IU] via SUBCUTANEOUS
  Administered 2014-08-09 – 2014-08-10 (×3): 1 [IU] via SUBCUTANEOUS
  Administered 2014-08-10 – 2014-08-11 (×3): 2 [IU] via SUBCUTANEOUS
  Administered 2014-08-11 (×4): 1 [IU] via SUBCUTANEOUS
  Administered 2014-08-11 – 2014-08-12 (×3): 2 [IU] via SUBCUTANEOUS
  Administered 2014-08-12 (×3): 1 [IU] via SUBCUTANEOUS
  Administered 2014-08-12 – 2014-08-13 (×5): 2 [IU] via SUBCUTANEOUS
  Administered 2014-08-14: 1 [IU] via SUBCUTANEOUS
  Administered 2014-08-14 (×2): 2 [IU] via SUBCUTANEOUS
  Administered 2014-08-14: 1 [IU] via SUBCUTANEOUS
  Administered 2014-08-15 – 2014-08-16 (×9): 2 [IU] via SUBCUTANEOUS
  Administered 2014-08-16: 1 [IU] via SUBCUTANEOUS
  Administered 2014-08-16: 2 [IU] via SUBCUTANEOUS
  Administered 2014-08-17 (×3): 1 [IU] via SUBCUTANEOUS
  Administered 2014-08-17: 2 [IU] via SUBCUTANEOUS
  Administered 2014-08-18 (×2): 1 [IU] via SUBCUTANEOUS
  Administered 2014-08-18: 2 [IU] via SUBCUTANEOUS
  Administered 2014-08-19: 1 [IU] via SUBCUTANEOUS
  Administered 2014-08-19 – 2014-08-20 (×3): 2 [IU] via SUBCUTANEOUS
  Administered 2014-08-20 – 2014-08-21 (×3): 1 [IU] via SUBCUTANEOUS

## 2014-08-05 MED ORDER — LORAZEPAM 1 MG PO TABS
1.0000 mg | ORAL_TABLET | Freq: Three times a day (TID) | ORAL | Status: DC
  Administered 2014-08-05 – 2014-08-06 (×4): 1 mg
  Filled 2014-08-05 (×4): qty 1

## 2014-08-05 MED ORDER — METHYLPREDNISOLONE SODIUM SUCC 125 MG IJ SOLR
60.0000 mg | Freq: Three times a day (TID) | INTRAMUSCULAR | Status: DC
  Administered 2014-08-05 – 2014-08-08 (×9): 60 mg via INTRAVENOUS
  Filled 2014-08-05 (×12): qty 0.96

## 2014-08-05 MED ORDER — POTASSIUM CHLORIDE 20 MEQ/15ML (10%) PO LIQD
30.0000 meq | ORAL | Status: AC
  Administered 2014-08-05 (×2): 30 meq
  Filled 2014-08-05 (×2): qty 22.5

## 2014-08-05 NOTE — Care Management Note (Addendum)
    Page 1 of 1   08/08/2014     10:51:35 AM CARE MANAGEMENT NOTE 08/08/2014  Patient:  [name],[name]   Account Number:  [id number]  Date Initiated:  08/05/2014  Documentation initiated by:  Junius CreamerWELL,DEBBIE  Subjective/Objective Assessment:   adm w pnuemonia     Action/Plan:   lives w wife, pcp dr Olena Leatherwoodhasanaj   Anticipated DC Date:     Anticipated DC Plan:           Choice offered to / List presented to:             Status of service:   Medicare Important Message given?  YES (If response is "NO", the following Medicare IM given date fields will be blank) Date Medicare IM given:  08/05/2014 Medicare IM given by:  Junius CreamerWELL,DEBBIE Date Additional Medicare IM given:  08/08/2014 Additional Medicare IM given [facility]:  [facility]ed[facility]  Discharge Disposition:    Per UR Regulation:    If discussed at Long Length of Stay Meetings, dates discussed:   08/08/2014    Comments:

## 2014-08-05 NOTE — Progress Notes (Signed)
Attempted to collect sputum with no success. Will attempt at a later time.

## 2014-08-05 NOTE — Progress Notes (Signed)
NUTRITION FOLLOW UP  Intervention:   Continue Vital HP at 40 ml/h and Prostat 30 ml BID on day 1; on day 2, d/c Prostat and increase to goal rate of 50 ml/h (1200 ml per day) to provide 1200 kcals, 105 gm protein, 1008 ml free water daily.  Propofol providing additional 259 kcal.  TF regimen with Propofol will provide 1459 kcal and 105 g protein (100% of estimated needs).   Nutrition Dx:   Inadequate oral intake related to inability to eat as evidenced by NPO; ongoing  Goal:   Pt to meet >/= 90% of their estimated nutrition needs; not met  Monitor:   TF initiation & regimen, PO diet advancement, weight, labs, I/O's  Assessment:   58 yom smoker with PMH of COPD, ETOH and benzo dep anxiety. Admitted 10/23 w/dx CAP and AECOPD.   Patient developed worsening resp failure and delirium/agitation. Transferred to MICU from 3S-Stepdown 10/24.   Required emergent intubation 10/25.   Patient is currently intubated on ventilator support MV: 7.7 L/min Temp (24hrs), Avg:97.7 F (36.5 C), Min:97.4 F (36.3 C), Max:98.1 F (36.7 C)  Propofol: 9.8 ml/hr  Patient has OG tube in place with tip of tube in stomach. Vital HP is infusing @ 40 ml/hr. 30 ml Prostat via tube once daily. Tube feeding regimen currently providing 1060 kcal, 99 grams protein, and 806 ml H2O.   Residuals: 140 mL this am.  Last bm: 10/24   Height: Ht Readings from Last 1 Encounters:  08/03/14 '5\' 6"'  (1.676 m)    Weight Status:   Wt Readings from Last 1 Encounters:  08/05/14 123 lb 3.8 oz (55.9 kg)    Re-estimated needs:  Kcal: 1456 Protein: 90-105 g Fluid: Per MD  Skin: intact  Diet Order: NPO   Intake/Output Summary (Last 24 hours) at 08/05/14 0946 Last data filed at 08/05/14 0818  Gross per 24 hour  Intake 4439.4 ml  Output   1579 ml  Net 2860.4 ml   Labs:   Recent Labs Lab 08/03/14 0500 08/04/14 1220 08/04/14 2200 08/05/14 0440  NA 137 134*  --  132*  K 3.8 4.3  --  3.2*  CL 96 91*  --   95*  CO2 30 26  --  29  BUN 16 19  --  21  CREATININE 0.56 0.62  --  0.57  CALCIUM 7.4* 6.7*  --  6.7*  MG  --  1.2* 1.2*  --   PHOS  --  4.9* 1.8*  --   GLUCOSE 127* 124*  --  147*    CBG (last 3)   Recent Labs  08/04/14 2346 08/05/14 0350 08/05/14 0820  GLUCAP 199* 138* 118*    Scheduled Meds: . antiseptic oral rinse  7 mL Mouth Rinse q12n4p  . aspirin  325 mg Per Tube Daily  . azithromycin  500 mg Intravenous Q24H  . cefTRIAXone (ROCEPHIN)  IV  1 g Intravenous Q24H  . chlorhexidine  15 mL Mouth Rinse BID  . Chlorhexidine Gluconate Cloth  6 each Topical Q0600  . feeding supplement (PRO-STAT SUGAR FREE 64)  30 mL Per Tube BID  . feeding supplement (VITAL HIGH PROTEIN)  1,000 mL Per Tube Q24H  . fentaNYL  100 mcg Intravenous Once  . folic acid  0.5 mg Per Tube Daily  . heparin  5,000 Units Subcutaneous 3 times per day  . insulin aspart  0-9 Units Subcutaneous 6 times per day  . ipratropium-albuterol  3 mL Nebulization Q4H  .  LORazepam  1 mg Per Tube TID  . magnesium sulfate 1 - 4 g bolus IVPB  4 g Intravenous Once  . methylPREDNISolone (SOLU-MEDROL) injection  60 mg Intravenous 3 times per day  . mupirocin ointment  1 application Nasal BID  . nicotine  14 mg Transdermal Daily  . pantoprazole sodium  40 mg Per Tube Daily  . phenytoin  100 mg Oral TID  . potassium chloride  30 mEq Per Tube Q4H  . sodium chloride  3 mL Intravenous Q12H  . thiamine IV  250 mg Intravenous Daily    Continuous Infusions: . sodium chloride 50 mL/hr (08/05/14 0932)  . fentaNYL infusion INTRAVENOUS 100 mcg/hr (08/05/14 0918)  . phenylephrine (NEO-SYNEPHRINE) Adult infusion 40 mcg/min (08/05/14 0818)  . propofol 30 mcg/kg/min (08/05/14 0800)    Laurette Schimke RD, LDN

## 2014-08-05 NOTE — Progress Notes (Addendum)
PULMONARY / CRITICAL CARE MEDICINE   Name: Kenneth GullyFrancis J Bass MRN: [id number] DOB: 1951-04-12    ADMISSION DATE:  May 24, 2014 CONSULTATION DATE:  10/24  REFERRING MD :  Criselda PeachesMullen   CHIEF COMPLAINT:  Acute on chronic respiratory failure   INITIAL PRESENTATION:  63 yo m smoker w/ sig h/o COPD, ETOH and benzo dep anxiety. Admitted 10/23 w/ working dx CAP and AECOPD. Developed worsening resp failure and delirium/agitation on 10/24 , intubated on 10/25 for sudden onset bronchospasm   STUDIES:    SIGNIFICANT EVENTS: 10/23: admitted 10/24: progressive resp failure, placed on BIPAP. Transferred to ICU  10/25: severe anxiety/ agitation. Desaturated and did not improve w/ BIPAP. Required emergent intubation.   SUBJECTIVE:  Agitated on WUA Afebrile Good UO  VITAL SIGNS: Temp:  [97.4 F (36.3 C)-97.9 F (36.6 C)] 97.7 F (36.5 C) (10/26 0353) Pulse Rate:  [54-113] 75 (10/26 0700) Resp:  [12-23] 20 (10/26 0700) BP: (69-159)/(47-121) 87/59 mmHg (10/26 0700) SpO2:  [88 %-100 %] 92 % (10/26 0700) Arterial Line BP: (64-122)/(38-78) 104/56 mmHg (10/26 0700) FiO2 (%):  [40 %-100 %] 40 % (10/26 0600) Weight:  [123 lb 3.8 oz (55.9 kg)] 123 lb 3.8 oz (55.9 kg) (10/26 0311) HEMODYNAMICS:   VENTILATOR SETTINGS: Vent Mode:  [-] PRVC FiO2 (%):  [40 %-100 %] 40 % Set Rate:  [20 bmp] 20 bmp Vt Set:  [400 mL-450 mL] 450 mL PEEP:  [5 cmH20] 5 cmH20 Plateau Pressure:  [12 cmH20-23 cmH20] 12 cmH20 INTAKE / OUTPUT:  Intake/Output Summary (Last 24 hours) at 08/05/14 0746 Last data filed at 08/05/14 0700  Gross per 24 hour  Intake 4375.2 ml  Output   1744 ml  Net 2631.2 ml    PHYSICAL EXAMINATION: General:  Chronically ill-appearing 63 yo male, appears older than stated age.  Neuro: Sedated on vent, RASS -2 HEENT:  Orally intubated, OGT Cardiovascular: RRR, no murmur or JVD Lungs: Coarse and diminished over R base with and expiratory wheezes throughout Abdomen:  Soft, + bowel sounds   Musculoskeletal:  Intact  Skin: Scattered bruising throughout, esp L lateral thigh. Telangiectasias across chest.   LABS:  CBC  Recent Labs Lab 04-28-2014 1712 08/03/14 0500 08/05/14 0440  WBC 4.8 3.4* 8.0  HGB 16.5 14.4 14.8  HCT 49.7 42.7 44.1  PLT 68* 66* 75*   Coag's No results found for this basename: APTT, INR,  in the last 168 hours BMET  Recent Labs Lab 08/03/14 0500 08/04/14 1220 08/05/14 0440  NA 137 134* 132*  K 3.8 4.3 3.2*  CL 96 91* 95*  CO2 30 26 29   BUN 16 19 21   CREATININE 0.56 0.62 0.57  GLUCOSE 127* 124* 147*   Electrolytes  Recent Labs Lab 08/03/14 0500 08/04/14 1220 08/04/14 2200 08/05/14 0440  CALCIUM 7.4* 6.7*  --  6.7*  MG  --  1.2* 1.2*  --   PHOS  --  4.9* 1.8*  --    Sepsis Markers No results found for this basename: LATICACIDVEN, PROCALCITON, O2SATVEN,  in the last 168 hours ABG  Recent Labs Lab 08/03/14 0855 08/04/14 1012 08/04/14 1304  PHART 7.355 7.197* 7.290*  PCO2ART 54.0* 84.9* 61.6*  PO2ART 98.0 295.0* 110.0*   Liver Enzymes  Recent Labs Lab 04-28-2014 1712  AST 23  ALT 6  ALKPHOS 107  BILITOT 1.0  ALBUMIN 3.9   Cardiac Enzymes  Recent Labs Lab 04-28-2014 1715 04-28-2014 2331 08/03/14 0500  TROPONINI  --  <0.30 <0.30  PROBNP 9080.0*  --   --  Glucose  Recent Labs Lab 08/03/14 1107 08/04/14 1245 08/04/14 1554 08/04/14 1918 08/04/14 2346 08/05/14 0350  GLUCAP 101* 122* 163* 148* 199* 138*    Imaging Dg Chest Port 1 View  08/04/2014   CLINICAL DATA:  63 year old with new right internal jugular central venous catheter.  EXAM: PORTABLE CHEST - 1 VIEW  COMPARISON:  Chest x-ray 08/04/2014.  FINDINGS: An endotracheal tube is in place with tip 5.5 cm above the carina. NG tube extending into the stomach. There is a right-sided internal jugular central venous catheter with tip terminating in the distal superior vena cava. No pneumothorax. Patchy multifocal interstitial and airspace opacities throughout  the mid to lower lungs bilaterally, concerning for multilobar pneumonia or sequela of recent aspiration. Overall, aeration appears slightly worse than earlier examination.  IMPRESSION: 1. Support apparatus, as above. Tip of new right IJ catheter is in the distal superior vena cava. 2. No pneumothorax. 3. Slight worsening of aeration, with findings again compatible with developing multilobar bronchopneumonia or sequela of recent aspiration.   Electronically Signed   By: Trudie Reed M.D.   On: 08/04/2014 13:36   Dg Chest Port 1 View  08/04/2014   CLINICAL DATA:  63 year old male with acute respiratory failure. Evaluate orogastric and endotracheal tube placement.  EXAM: PORTABLE CHEST - 1 VIEW  COMPARISON:  Chest x-ray 08/03/2014.  FINDINGS: An endotracheal tube is in place with tip 5.1 cm above the carina. An enteric tube is seen extending into the stomach, however, the tip of the nasogastric tube extends below the lower margin of the image. Emphysematous changes are noted in the lungs bilaterally. There are patchy interstitial and airspace opacities throughout the mid to lower lungs bilaterally, particularly in the right lung base, concerning for either sequela of recent aspiration or developing multilobar bronchopneumonia. Small bilateral pleural effusions (right greater than left). No evidence of pulmonary edema. Heart size is normal. Upper mediastinal contours are within normal limits.  IMPRESSION: 1. Support apparatus, as above. 2. Findings concerning for either sequela of recent aspiration or developing multilobar bronchopneumonia, most severe in the right lower lobe.   Electronically Signed   By: Trudie Reed M.D.   On: 08/04/2014 10:22  ETT good position. Worsening R>L airspace disease.   ASSESSMENT / PLAN:  PULMONARY OETT 10/25>>> A: Acute hypoxic and hypercarbic respiratory failure -Required intubation  more as a result of a panic attack superimposed on prob ETOH w/d  PNA  AECOPD     P:   Full vent support: 40% / 5 / 20 / 450 Scheduled nebs Solumedrol 60 q8h Keep RR 16 to avoid autoPEEP   CARDIOVASCULAR CVL 10/25 >> A-line 10/25 >>  A: SIRS/sepsis  P:  Cont pressors - neo @20  Tele    RENAL A:  Hypokalemia/ hypomag Hypochloremia P:   IV hydration  Serial chems  Replete electrolytes prn - K & Mg  GASTROINTESTINAL OGT A:   Aspiration risk  P:   NPO  PPI  TFs  HEMATOLOGIC A:   Mild thrombocytopenia - hepatic vs. sepsis Ecchymoses  P:  Trend CBC  [place] heparin  Check coags  INFECTIOUS A:   CAP +MRSA by PCR screen  P:   BCx2 10/23>>> resp viral panel 10/24>>> Abx: azithro 10/23>>> Abx: rocephin 10/23>>> Add anaerobic coverage for aspiration if worsens Contact isolation per protocol  ENDOCRINE A:   Hyperglycemia - stress and steroids P:   SSI protocol   NEUROLOGIC A:   Acute encephalopathy  Panic attack DTs  P:  RASS goal: -3 Propofol for sedation Cont fentanyl gtt, add ativan 1 mg tid Cont thiamine (250mg  IV daily x5 days) and folate   FAMILY  - Updates: wife & din law at bedside 10/26  - Inter-disciplinary family meet or Palliative Care meeting due by:  10/31   Ryan B. Jarvis NewcomerGrunz, MD, PGY-2  TODAY'S SUMMARY: Wean from vent, continue CIWA protocol and scheduled benzo for panic attack/DTs and bronchodilators for brochospasm. Wean pressors. Continue antibiotics.   The patient is critically ill with multiple organ systems failure and requires high complexity decision making for assessment and support, frequent evaluation and titration of therapies, application of advanced monitoring technologies and extensive interpretation of multiple databases. Critical Care Time devoted to patient care services described in this note is 35 minutes.   Care during the described time interval was provided by me and/or other providers on the critical care team.  I have reviewed this patient's available data, including medical history, events  of note, physical examination and test results as part of my evaluation  ALVA,RAKESH V. MD 08/05/2014 8:01 AM

## 2014-08-05 NOTE — Progress Notes (Signed)
[facility] FOR AM LAB REPLACEMENT ONLY  The [facility] does [facility] based on the criteria listed below:   1. Is GFR >/= 40 ml/min? Yes.    Patient's GFR today is >90 2. Is urine output >/= 0.5 ml/kg/hr for the last 6 hours? Yes.   Patient's UOP is 2.1 ml/kg/hr 3. Is BUN < 60 mg/dL? Yes.    Patient's BUN today is 21 4. Abnormal electrolyte(s): K 3.2 5. Ordered repletion with: per protocol 6. If a panic level lab has been reported, has the CCM MD in charge been notified? No..   Physician:    Markus DaftWHELAN, Shanon Seawright A 08/05/2014 5:48 AM

## 2014-08-05 NOTE — Progress Notes (Signed)
Sputum sample collected. RN aware.

## 2014-08-06 ENCOUNTER — Inpatient Hospital Stay (HOSPITAL_COMMUNITY): Payer: Medicare Other

## 2014-08-06 LAB — LACTIC ACID, PLASMA: Lactic Acid, Venous: 0.8 mmol/L (ref 0.5–2.2)

## 2014-08-06 LAB — BASIC METABOLIC PANEL
Anion gap: 9 (ref 5–15)
BUN: 23 mg/dL (ref 6–23)
CHLORIDE: 99 meq/L (ref 96–112)
CO2: 28 meq/L (ref 19–32)
CREATININE: 0.61 mg/dL (ref 0.50–1.35)
Calcium: 7 mg/dL — ABNORMAL LOW (ref 8.4–10.5)
GFR calc Af Amer: >90 mL/min (ref >90)
GFR calc non Af Amer: >90 mL/min (ref >90)
GLUCOSE: 178 mg/dL — AB (ref 70–99)
Potassium: 4.5 mEq/L (ref 3.7–5.3)
Sodium: 136 mEq/L — ABNORMAL LOW (ref 137–147)

## 2014-08-06 LAB — BLOOD GAS, ARTERIAL
ACID-BASE EXCESS: 4.3 mmol/L — AB (ref 0.0–2.0)
BICARBONATE: 30 meq/L — AB (ref 20.0–24.0)
Drawn by: 31288
FIO2: 0.4 %
MECHVT: 450 mL
O2 Saturation: 90.4 %
PCO2 ART: 59.6 mmHg — AB (ref 35.0–45.0)
PEEP: 5 cmH2O
PH ART: 7.322 — AB (ref 7.350–7.450)
PO2 ART: 63.2 mmHg — AB (ref 80.0–100.0)
Patient temperature: 98.6
RATE: 16 resp/min
TCO2: 31.8 mmol/L (ref 0–100)

## 2014-08-06 LAB — GLUCOSE, CAPILLARY
GLUCOSE-CAPILLARY: 133 mg/dL — AB (ref 70–99)
GLUCOSE-CAPILLARY: 147 mg/dL — AB (ref 70–99)
GLUCOSE-CAPILLARY: 158 mg/dL — AB (ref 70–99)
GLUCOSE-CAPILLARY: 184 mg/dL — AB (ref 70–99)
Glucose-Capillary: 127 mg/dL — ABNORMAL HIGH (ref 70–99)
Glucose-Capillary: 161 mg/dL — ABNORMAL HIGH (ref 70–99)

## 2014-08-06 LAB — CBC
HCT: 40.4 % (ref 39.0–52.0)
HEMOGLOBIN: 13.3 g/dL (ref 13.0–17.0)
MCH: 33.3 pg (ref 26.0–34.0)
MCHC: 32.9 g/dL (ref 30.0–36.0)
MCV: 101 fL — AB (ref 78.0–100.0)
Platelets: 62 10*3/uL — ABNORMAL LOW (ref 150–400)
RBC: 4 MIL/uL — AB (ref 4.22–5.81)
RDW: 15.8 % — ABNORMAL HIGH (ref 11.5–15.5)
WBC: 5.7 10*3/uL (ref 4.0–10.5)

## 2014-08-06 LAB — MAGNESIUM
Magnesium: 2.6 mg/dL — ABNORMAL HIGH (ref 1.5–2.5)
Magnesium: 2.2 mg/dL (ref 1.5–2.5)

## 2014-08-06 LAB — PHOSPHORUS
PHOSPHORUS: 2.4 mg/dL (ref 2.3–4.6)
PHOSPHORUS: 2.3 mg/dL (ref 2.3–4.6)

## 2014-08-06 LAB — CK: Total CK: 166 U/L (ref 7–232)

## 2014-08-06 MED ORDER — CETYLPYRIDINIUM CHLORIDE 0.05 % MT LIQD
7.0000 mL | Freq: Four times a day (QID) | OROMUCOSAL | Status: DC
  Administered 2014-08-07 – 2014-08-21 (×59): 7 mL via OROMUCOSAL

## 2014-08-06 MED ORDER — VITAL HIGH PROTEIN PO LIQD
1000.0000 mL | ORAL | Status: DC
  Administered 2014-08-06 – 2014-08-07 (×2): 1000 mL
  Administered 2014-08-08: 15:00:00
  Administered 2014-08-08 – 2014-08-11 (×6): 1000 mL
  Administered 2014-08-11: 07:00:00
  Administered 2014-08-12: 1000 mL
  Filled 2014-08-06 (×8): qty 1000

## 2014-08-06 MED ORDER — DEXMEDETOMIDINE HCL IN NACL 400 MCG/100ML IV SOLN
0.4000 ug/kg/h | INTRAVENOUS | Status: DC
  Administered 2014-08-06: 0.4 ug/kg/h via INTRAVENOUS
  Administered 2014-08-06: 0.6 ug/kg/h via INTRAVENOUS
  Administered 2014-08-06 – 2014-08-07 (×2): 1 ug/kg/h via INTRAVENOUS
  Filled 2014-08-06: qty 50
  Filled 2014-08-06 (×2): qty 100
  Filled 2014-08-06: qty 50
  Filled 2014-08-06: qty 100

## 2014-08-06 MED ORDER — LORAZEPAM 1 MG PO TABS
2.0000 mg | ORAL_TABLET | Freq: Two times a day (BID) | ORAL | Status: DC
  Administered 2014-08-06 – 2014-08-07 (×2): 2 mg
  Filled 2014-08-06 (×2): qty 2

## 2014-08-06 MED ORDER — CHLORHEXIDINE GLUCONATE 0.12 % MT SOLN
15.0000 mL | Freq: Two times a day (BID) | OROMUCOSAL | Status: DC
  Administered 2014-08-07 – 2014-08-21 (×29): 15 mL via OROMUCOSAL
  Filled 2014-08-06 (×27): qty 15

## 2014-08-06 MED ORDER — PHENYTOIN SODIUM 50 MG/ML IJ SOLN
100.0000 mg | Freq: Three times a day (TID) | INTRAMUSCULAR | Status: DC
  Administered 2014-08-06 – 2014-08-10 (×12): 100 mg via INTRAVENOUS
  Filled 2014-08-06 (×17): qty 2

## 2014-08-06 NOTE — Progress Notes (Signed)
PULMONARY / CRITICAL CARE MEDICINE   Name: Kenneth GullyFrancis J Angus MRN: [id number] DOB: 05-07-1951    ADMISSION DATE:  08/10/2014 CONSULTATION DATE:  10/24  REFERRING MD :  Criselda PeachesMullen   CHIEF COMPLAINT:  Acute on chronic respiratory failure   INITIAL PRESENTATION:  63 yo m smoker w/ sig h/o COPD, ETOH and benzo dep anxiety. Admitted 10/23 w/ working dx CAP and AECOPD. Developed worsening resp failure and delirium/agitation on 10/24 , intubated on 10/25 for sudden onset bronchospasm   STUDIES:    SIGNIFICANT EVENTS: 10/23: admitted 10/24: progressive resp failure, placed on BIPAP. Transferred to ICU  10/25: severe anxiety/ agitation. Desaturated and did not improve w/ BIPAP. Required emergent intubation.  10/27: off pressors  SUBJECTIVE:  Off pressors Afebrile Good UO Severe agitation on WUA off propofol gtt  VITAL SIGNS: Temp:  [97.5 F (36.4 C)-98.4 F (36.9 C)] 98.4 F (36.9 C) (10/27 0339) Pulse Rate:  [76-90] 86 (10/27 0600) Resp:  [10-21] 15 (10/27 0600) BP: (84-121)/(48-79) 86/50 mmHg (10/27 0400) SpO2:  [88 %-95 %] 94 % (10/27 0600) Arterial Line BP: (93-155)/(45-73) 145/68 mmHg (10/27 0600) FiO2 (%):  [40 %] 40 % (10/27 0500) Weight:  [129 lb 3 oz (58.6 kg)] 129 lb 3 oz (58.6 kg) (10/27 0500) HEMODYNAMICS: CVP:  [10 mmHg] 10 mmHg VENTILATOR SETTINGS: Vent Mode:  [-] PRVC FiO2 (%):  [40 %] 40 % Set Rate:  [16 bmp-20 bmp] 16 bmp Vt Set:  [450 mL] 450 mL PEEP:  [5 cmH20] 5 cmH20 Plateau Pressure:  [9 cmH20-17 cmH20] 14 cmH20 INTAKE / OUTPUT:  Intake/Output Summary (Last 24 hours) at 08/06/14 [id number] Last data filed at 08/06/14 0600  Gross per 24 hour  Intake 3292.7 ml  Output   1065 ml  Net 2227.7 ml    PHYSICAL EXAMINATION: General:  Chronically ill-appearing 63 yo male, appears older than stated age.  Neuro: Sedated on vent, RASS -2 HEENT:  Scattered abrasions on scalp, orally intubated, OGT Cardiovascular: RRR, no murmur or JVD Lungs: Coarse and  diminished over R base no wheezing Abdomen:  Soft, + bowel sounds  Musculoskeletal:  Intact  Skin: Scattered bruising throughout, esp R lateral thigh.   LABS:  CBC  Recent Labs Lab 08/03/14 0500 08/05/14 0440 08/06/14 0445  WBC 3.4* 8.0 5.7  HGB 14.4 14.8 13.3  HCT 42.7 44.1 40.4  PLT 66* 75* 62*   Coag's  Recent Labs Lab 08/05/14 1010  APTT 31  INR 1.19   BMET  Recent Labs Lab 08/04/14 1220 08/05/14 0440 08/06/14 0445  NA 134* 132* 136*  K 4.3 3.2* 4.5  CL 91* 95* 99  CO2 26 29 28   BUN 19 21 23   CREATININE 0.62 0.57 0.61  GLUCOSE 124* 147* 178*   Electrolytes  Recent Labs Lab 08/04/14 1220  08/05/14 0440 08/05/14 1010 08/05/14 2150 08/06/14 0445  CALCIUM 6.7*  --  6.7*  --   --  7.0*  MG 1.2*  < >  --  1.4* 2.7* 2.6*  PHOS 4.9*  < >  --  2.0* 2.2* 2.4  < > = values in this interval not displayed. Sepsis Markers  Recent Labs Lab 08/06/14 0445  LATICACIDVEN 0.8   ABG  Recent Labs Lab 08/04/14 1012 08/04/14 1304 08/06/14 0203  PHART 7.197* 7.290* 7.322*  PCO2ART 84.9* 61.6* 59.6*  PO2ART 295.0* 110.0* 63.2*   Liver Enzymes  Recent Labs Lab 07/11/2014 1712  AST 23  ALT 6  ALKPHOS 107  BILITOT 1.0  ALBUMIN 3.9  Cardiac Enzymes  Recent Labs Lab 07/13/2014 1715 07/20/2014 2331 08/03/14 0500  TROPONINI  --  <0.30 <0.30  PROBNP 9080.0*  --   --    Glucose  Recent Labs Lab 08/05/14 0820 08/05/14 1130 08/05/14 1555 08/05/14 2020 08/06/14 0013 08/06/14 0341  GLUCAP 118* 172* 146* 77 184* 158*    Imaging Dg Chest Port 1 View  08/05/2014   CLINICAL DATA:  Subsequent evaluation for pneumonia  EXAM: PORTABLE CHEST - 1 VIEW  COMPARISON:  08/04/2014  FINDINGS: Central line, NG tube, and endotracheal tube all appear unchanged. The heart size and vascular pattern are normal.  Hazy density right lower lobe, slightly worse with blunting of the right costophrenic angle. Mild hazy density left lower lobe also worse when compared to  prior study.  IMPRESSION: Mildly worse hazy opacifications both lung bases suggesting a combination of small pleural effusions and underlying airspace disease.   Electronically Signed   By: Esperanza Heiraymond  Rubner M.D.   On: 08/05/2014 07:52  ETT good position. Worsening R>L airspace disease.   ASSESSMENT / PLAN:  PULMONARY OETT 10/25>>> A: Acute hypoxic and hypercarbic respiratory failure -Required intubation  more as a result of a panic attack superimposed on prob ETOH w/d  PNA  AECOPD    P:   Full vent support: 40% / 5 / 16 / 450 Scheduled nebs Solumedrol 60 q8h Keep RR 16 to avoid autoPEEP Daily SBT  CARDIOVASCULAR CVL 10/25 >> A-line 10/25 >>  A: SIRS/sepsis - resolved P:  Tele  Off pressors since 0200  RENAL A:  Hypokalemia/ hypomag - resolved Hypochloremia - resolved P:   Gentle IV hydration - NS @ 50cc/hr Serial chems  Replete electrolytes prn  GASTROINTESTINAL OGT >> A:   Aspiration risk  P:   NPO  PPI  TFs @ 40cc/hr  HEMATOLOGIC A:   Mild thrombocytopenia - hepatic vs. sepsis Ecchymoses  P:  Trend CBC  Monitor signs of bleeding [place] heparin   INFECTIOUS A:   CAP +MRSA by PCR screen  P:   BCx2 10/23>>>ng resp viral panel 10/24>>>Negative Abx: azithro 10/23>>> Abx: rocephin 10/23>>> D/C droplet isolation Contact isolation per protocol  ENDOCRINE A:   Hyperglycemia - stress and steroids P:   SSI  NEUROLOGIC A:   Acute encephalopathy  Panic attack DTs  P:   RASS goal: -3 Propofol for sedation Cont fentanyl gtt, ativan 1 mg tid Cont thiamine (250mg  IV daily x5 days) and folate   FAMILY  - Updates: wife & din law at bedside 10/26  - Inter-disciplinary family meet or Palliative Care meeting due by:  10/31   Ryan B. Jarvis NewcomerGrunz, MD, PGY-2  TODAY'S SUMMARY: Continue weaning efforts. Continue antibiotics, scheduled benzo for panic attack/DTs and bronchodilators/steroids for brochospasm.  The patient is critically ill with multiple organ  systems failure and requires high complexity decision making for assessment and support, frequent evaluation and titration of therapies, application of advanced monitoring technologies and extensive interpretation of multiple databases. Critical Care Time devoted to patient care services described in this note is 35 minutes.   Oretha MilchALVA,Insiya Oshea V. MD

## 2014-08-06 NOTE — Plan of Care (Signed)
Problem: Consults Goal: Ventilated Patients Patient Education See Patient Education Module for education specifics. Outcome: Progressing Patient unable to learn, family educated

## 2014-08-06 NOTE — Progress Notes (Addendum)
CRITICAL VALUE ALERT  Critical value received:  Pco2=59.6  Date of notification: 08/06/14  Time of notification:  0215  Critical value read back:Yes.    Nurse who received alert:  Dustin Flockhris Aseneth Hack RN   MD notified (1st page):  Dr Deterding  Time of first page:  0218  MD notified (2nd page):  Time of second page:  Responding MD:  Dr Darrick Pennaeterding  Time MD responded:  [phone]

## 2014-08-06 NOTE — Plan of Care (Signed)
Problem: Phase I Progression Outcomes Goal: Patient tolerating weaning plan Outcome: Not Progressing Patient unable to tolerating weaning this am.   Goal: Voiding-avoid urinary catheter unless indicated Outcome: Not Met (add Reason) Patient has a foley catheter

## 2014-08-07 ENCOUNTER — Inpatient Hospital Stay (HOSPITAL_COMMUNITY): Payer: Medicare Other

## 2014-08-07 LAB — CBC
HEMATOCRIT: 42.3 % (ref 39.0–52.0)
HEMOGLOBIN: 13.8 g/dL (ref 13.0–17.0)
MCH: 33.3 pg (ref 26.0–34.0)
MCHC: 32.6 g/dL (ref 30.0–36.0)
MCV: 102.2 fL — ABNORMAL HIGH (ref 78.0–100.0)
Platelets: 75 10*3/uL — ABNORMAL LOW (ref 150–400)
RBC: 4.14 MIL/uL — ABNORMAL LOW (ref 4.22–5.81)
RDW: 15.7 % — ABNORMAL HIGH (ref 11.5–15.5)
WBC: 6.6 10*3/uL (ref 4.0–10.5)

## 2014-08-07 LAB — BASIC METABOLIC PANEL
Anion gap: 7 (ref 5–15)
BUN: 23 mg/dL (ref 6–23)
CALCIUM: 7.9 mg/dL — AB (ref 8.4–10.5)
CO2: 31 mEq/L (ref 19–32)
Chloride: 99 mEq/L (ref 96–112)
Creatinine, Ser: 0.49 mg/dL — ABNORMAL LOW (ref 0.50–1.35)
GFR calc Af Amer: >90 mL/min (ref >90)
GLUCOSE: 140 mg/dL — AB (ref 70–99)
Potassium: 5.1 mEq/L (ref 3.7–5.3)
SODIUM: 137 meq/L (ref 137–147)

## 2014-08-07 LAB — GLUCOSE, CAPILLARY
Glucose-Capillary: 117 mg/dL — ABNORMAL HIGH (ref 70–99)
Glucose-Capillary: 120 mg/dL — ABNORMAL HIGH (ref 70–99)
Glucose-Capillary: 147 mg/dL — ABNORMAL HIGH (ref 70–99)
Glucose-Capillary: 180 mg/dL — ABNORMAL HIGH (ref 70–99)
Glucose-Capillary: 193 mg/dL — ABNORMAL HIGH (ref 70–99)
Glucose-Capillary: 214 mg/dL — ABNORMAL HIGH (ref 70–99)

## 2014-08-07 LAB — PHOSPHORUS
Phosphorus: 2.7 mg/dL (ref 2.3–4.6)
Phosphorus: 3.8 mg/dL (ref 2.3–4.6)

## 2014-08-07 LAB — TRIGLYCERIDES: TRIGLYCERIDES: 91 mg/dL (ref <150)

## 2014-08-07 LAB — MAGNESIUM
Magnesium: 2.1 mg/dL (ref 1.5–2.5)
Magnesium: 1.9 mg/dL (ref 1.5–2.5)

## 2014-08-07 MED ORDER — SENNA 8.6 MG PO TABS
1.0000 | ORAL_TABLET | Freq: Every day | ORAL | Status: DC
  Administered 2014-08-07 – 2014-08-21 (×14): 8.6 mg
  Filled 2014-08-07 (×15): qty 1

## 2014-08-07 MED ORDER — LORAZEPAM 1 MG PO TABS
2.0000 mg | ORAL_TABLET | Freq: Three times a day (TID) | ORAL | Status: DC
  Administered 2014-08-07 – 2014-08-08 (×5): 2 mg
  Filled 2014-08-07 (×5): qty 2

## 2014-08-07 MED ORDER — DOCUSATE SODIUM 50 MG/5ML PO LIQD
50.0000 mg | Freq: Two times a day (BID) | ORAL | Status: DC
  Administered 2014-08-07 – 2014-08-11 (×8): 50 mg
  Filled 2014-08-07 (×10): qty 10

## 2014-08-07 MED ORDER — QUETIAPINE FUMARATE 100 MG PO TABS
100.0000 mg | ORAL_TABLET | Freq: Two times a day (BID) | ORAL | Status: DC
  Administered 2014-08-07 – 2014-08-11 (×9): 100 mg
  Filled 2014-08-07 (×10): qty 1

## 2014-08-07 MED ORDER — FOLIC ACID 1 MG PO TABS
1.0000 mg | ORAL_TABLET | Freq: Every day | ORAL | Status: DC
  Administered 2014-08-08 – 2014-08-21 (×14): 1 mg
  Filled 2014-08-07 (×14): qty 1

## 2014-08-07 MED ORDER — FUROSEMIDE 10 MG/ML IJ SOLN
40.0000 mg | Freq: Every day | INTRAMUSCULAR | Status: DC
  Administered 2014-08-07 – 2014-08-11 (×5): 40 mg via INTRAVENOUS
  Filled 2014-08-07 (×6): qty 4

## 2014-08-07 NOTE — Progress Notes (Signed)
PULMONARY / CRITICAL CARE MEDICINE   Name: Kenneth Bass MRN: [id number] DOB: 11-08-1950    ADMISSION DATE:  08/03/2014 CONSULTATION DATE:  10/24  REFERRING MD :  Criselda Peaches   CHIEF COMPLAINT:  Acute on chronic respiratory failure   INITIAL PRESENTATION:  63 yo m smoker w/ sig h/o COPD, ETOH and benzo dep anxiety. Admitted 10/23 w/ working dx CAP and AECOPD. Developed worsening resp failure and delirium/agitation on 10/24 , intubated on 10/25 for sudden onset bronchospasm   STUDIES:    SIGNIFICANT EVENTS: 10/23: admitted 10/24: progressive resp failure, placed on BIPAP. Transferred to ICU  10/25: severe anxiety/ agitation. Desaturated and did not improve w/ BIPAP. Required emergent intubation.  10/27: off pressors  SUBJECTIVE:  Afebrile Good UO Severe agitation on WUA off propofol gtt  VITAL SIGNS: Temp:  [97.4 F (36.3 C)-98.6 F (37 C)] 98.6 F (37 C) (10/28 0357) Pulse Rate:  [69-101] 70 (10/28 0600) Resp:  [13-23] 17 (10/28 0600) BP: (85-171)/(50-86) 124/74 mmHg (10/28 0600) SpO2:  [65 %-99 %] 95 % (10/28 0600) Arterial Line BP: (105-190)/(45-89) 148/68 mmHg (10/28 0600) FiO2 (%):  [40 %] 40 % (10/28 0400) Weight:  [134 lb 0.6 oz (60.8 kg)] 134 lb 0.6 oz (60.8 kg) (10/28 0500) HEMODYNAMICS: CVP:  [10 mmHg-11 mmHg] 10 mmHg VENTILATOR SETTINGS: Vent Mode:  [-] PRVC FiO2 (%):  [40 %] 40 % Set Rate:  [16 bmp] 16 bmp Vt Set:  [450 mL] 450 mL PEEP:  [5 cmH20] 5 cmH20 Plateau Pressure:  [14 cmH20-16 cmH20] 16 cmH20 INTAKE / OUTPUT:  Intake/Output Summary (Last 24 hours) at 08/07/14 0735 Last data filed at 08/07/14 0600  Gross per 24 hour  Intake 2609.6 ml  Output   1250 ml  Net 1359.6 ml    PHYSICAL EXAMINATION: General:  Thin chronically ill-appearing 63 yo male, appears older than stated age. No distress Neuro: Sedated on vent, RASS -3, +3 on WUA HEENT:  Scattered abrasions on scalp, orally intubated, OGT Cardiovascular: RRR, no murmur or JVD Lungs:  Coarse and diminished over bases; no wheezing Abdomen:  Soft, + bowel sounds  Musculoskeletal:  Intact  Skin: Resolving ecchymoses  LABS:  CBC  Recent Labs Lab 08/03/14 0500 08/05/14 0440 08/06/14 0445  WBC 3.4* 8.0 5.7  HGB 14.4 14.8 13.3  HCT 42.7 44.1 40.4  PLT 66* 75* 62*   Coag's  Recent Labs Lab 08/05/14 1010  APTT 31  INR 1.19   BMET  Recent Labs Lab 08/04/14 1220 08/05/14 0440 08/06/14 0445  NA 134* 132* 136*  K 4.3 3.2* 4.5  CL 91* 95* 99  CO2 26 29 28   BUN 19 21 23   CREATININE 0.62 0.57 0.61  GLUCOSE 124* 147* 178*   Electrolytes  Recent Labs Lab 08/04/14 1220  08/05/14 0440  08/06/14 0445 08/06/14 1700 08/07/14 0410  CALCIUM 6.7*  --  6.7*  --  7.0*  --   --   MG 1.2*  < >  --   < > 2.6* 2.2 2.1  PHOS 4.9*  < >  --   < > 2.4 2.3 2.7  < > = values in this interval not displayed. Sepsis Markers  Recent Labs Lab 08/06/14 0445  LATICACIDVEN 0.8   ABG  Recent Labs Lab 08/04/14 1012 08/04/14 1304 08/06/14 0203  PHART 7.197* 7.290* 7.322*  PCO2ART 84.9* 61.6* 59.6*  PO2ART 295.0* 110.0* 63.2*   Liver Enzymes  Recent Labs Lab 2014/08/03 1712  AST 23  ALT 6  ALKPHOS 107  BILITOT 1.0  ALBUMIN 3.9   Cardiac Enzymes  Recent Labs Lab Mar 12, 2014 1715 Mar 12, 2014 2331 08/03/14 0500  TROPONINI  --  <0.30 <0.30  PROBNP 9080.0*  --   --    Glucose  Recent Labs Lab 08/06/14 0825 08/06/14 1154 08/06/14 1621 08/06/14 2013 08/07/14 0024 08/07/14 0354  GLUCAP 147* 127* 133* 161* 214* 180*    Imaging Dg Chest Port 1 View  08/06/2014   CLINICAL DATA:  Difficulty breathing/acute respiratory failure  EXAM: PORTABLE CHEST - 1 VIEW  COMPARISON:  August 05, 2014  FINDINGS: Endotracheal tube tip is 3.0 cm above the carina. Central catheter tip is in the superior vena cava. Nasogastric tube tip and side port are in the stomach region. No pneumothorax. There is cardiomegaly with pulmonary venous hypertension and moderate interstitial  edema. There are bilateral effusions. There is consolidation in the lung bases, slightly more on the left than on the right.  IMPRESSION: Tube and catheter positions as described without pneumothorax. Evidence of congestive heart failure. Consolidation in the bases may well represent alveolar edema ; superimposed pneumonia cannot be excluded in the bases. Both congestive heart failure and pneumonia may exist concurrently.   Electronically Signed   By: Bretta BangWilliam  Woodruff M.D.   On: 08/06/2014 07:26  ETT good position. Worsening R>L airspace disease.   ASSESSMENT / PLAN:  PULMONARY OETT 10/25>>> A: Acute hypoxic and hypercarbic respiratory failure -Required intubation  more as a result of a panic attack superimposed on prob ETOH w/d  PNA  AECOPD    P:   Full vent support: 40% / 5 / 16 / 450 Nebs prn wheezing Solumedrol 60 q8h Daily SBT  CARDIOVASCULAR CVL 10/25 >> A-line 10/25 >>  A: Sepsis - resolved off pressors P:  Tele   RENAL A:  Hypokalemia/ hypomag - resolved Hypochloremia - resolved P:   Serial chems  Replete electrolytes prn Lasix 40 daily for neg balance  GASTROINTESTINAL OGT >> A:   Aspiration risk  Protein calorie malnutrition P:   PPI  TFs @ 40cc/hr  HEMATOLOGIC A:   Mild thrombocytopenia - hepatic vs. sepsis Ecchymoses  P:  Trend CBC  Monitor signs of bleeding [place] heparin   INFECTIOUS A:   CAP +MRSA by PCR screen  P:   BCx2 10/23>>>ngtd resp viral panel 10/24>>>Negative Abx: azithro 10/23>>>10/28 Abx: rocephin 10/23>>> Contact isolation per protocol  ENDOCRINE A:   Hyperglycemia - stress and steroids P:   SSI  NEUROLOGIC A:   Acute encephalopathy -failed precedex gtt Panic attack DTs  P:   RASS goal: 0 Propofol gtt Cont fentanyl gtt, ativan 2mg  BID, add seroquel 100 bid -monitor 1Tc Cont thiamine (250mg  IV daily x5 days) and folate   FAMILY  - Updates: wife & DIL at bedside 10/27  - Inter-disciplinary family meet or  Palliative Care meeting due by:  10/31   Ryan B. Jarvis NewcomerGrunz, MD, PGY-2  Summary  - Agitation & poor pre-morbid lung function main issues - added seroquel & maximised ativan to facilitate wean. Diurese gently as Bp permits. Initiated palliative discussion with wife who says 'he wanted us to do everything'  The patient is critically ill with multiple organ systems failure and requires high complexity decision making for assessment and support, frequent evaluation and titration of therapies, application of advanced monitoring technologies and extensive interpretation of multiple databases. Critical Care Time devoted to patient care services described in this note is 35 minutes.   Care during the described time interval was provided by  me and/or other providers on the critical care team.  I have reviewed this patient's available data, including medical history, events of note, physical examination and test results as part of my evaluation  ALVA,RAKESH V. MD 08/07/2014 7:35 AM

## 2014-08-08 ENCOUNTER — Inpatient Hospital Stay (HOSPITAL_COMMUNITY): Payer: Medicare Other

## 2014-08-08 LAB — CULTURE, RESPIRATORY: CULTURE: NO GROWTH

## 2014-08-08 LAB — BASIC METABOLIC PANEL
Anion gap: 7 (ref 5–15)
BUN: 28 mg/dL — AB (ref 6–23)
CO2: 36 mEq/L — ABNORMAL HIGH (ref 19–32)
CREATININE: 0.51 mg/dL (ref 0.50–1.35)
Calcium: 8.8 mg/dL (ref 8.4–10.5)
Chloride: 98 mEq/L (ref 96–112)
GFR calc Af Amer: >90 mL/min (ref >90)
GLUCOSE: 140 mg/dL — AB (ref 70–99)
Potassium: 5.2 mEq/L (ref 3.7–5.3)
Sodium: 141 mEq/L (ref 137–147)

## 2014-08-08 LAB — PHOSPHORUS
PHOSPHORUS: 4.3 mg/dL (ref 2.3–4.6)
Phosphorus: 4.4 mg/dL (ref 2.3–4.6)

## 2014-08-08 LAB — GLUCOSE, CAPILLARY
GLUCOSE-CAPILLARY: 120 mg/dL — AB (ref 70–99)
GLUCOSE-CAPILLARY: 126 mg/dL — AB (ref 70–99)
GLUCOSE-CAPILLARY: 141 mg/dL — AB (ref 70–99)
GLUCOSE-CAPILLARY: 142 mg/dL — AB (ref 70–99)
Glucose-Capillary: 138 mg/dL — ABNORMAL HIGH (ref 70–99)
Glucose-Capillary: 148 mg/dL — ABNORMAL HIGH (ref 70–99)

## 2014-08-08 LAB — MAGNESIUM
MAGNESIUM: 1.4 mg/dL — AB (ref 1.5–2.5)
Magnesium: 1.8 mg/dL (ref 1.5–2.5)

## 2014-08-08 MED ORDER — METHYLPREDNISOLONE SODIUM SUCC 125 MG IJ SOLR
60.0000 mg | Freq: Two times a day (BID) | INTRAMUSCULAR | Status: DC
  Administered 2014-08-08 – 2014-08-09 (×2): 60 mg via INTRAVENOUS
  Filled 2014-08-08: qty 0.96
  Filled 2014-08-08: qty 2
  Filled 2014-08-08 (×3): qty 0.96

## 2014-08-08 NOTE — Progress Notes (Signed)
PULMONARY / CRITICAL CARE MEDICINE   Name: Kenneth Bass MRN: [id number] DOB: 11/09/1950    ADMISSION DATE:  07/22/2014 CONSULTATION DATE:  10/24  REFERRING MD :  Criselda PeachesMullen   CHIEF COMPLAINT:  Acute on chronic respiratory failure   INITIAL PRESENTATION:  63 yo m smoker w/ sig h/o COPD, ETOH and benzo dep anxiety. Admitted 10/23 w/ working dx CAP and AECOPD. Developed worsening resp failure and delirium/agitation on 10/24 , intubated on 10/25 for sudden onset bronchospasm   STUDIES:    SIGNIFICANT EVENTS: 10/23: admitted 10/24: progressive resp failure, placed on BIPAP. Transferred to ICU  10/25: severe anxiety/ agitation. Desaturated and did not improve w/ BIPAP. Required emergent intubation.  10/27: off pressors  SUBJECTIVE:  Afebrile Good UO Blank stare on WUA off propofol gtt  VITAL SIGNS: Temp:  [98.1 F (36.7 C)-98.6 F (37 C)] 98.4 F (36.9 C) (10/29 0820) Pulse Rate:  [75-109] 109 (10/29 1000) Resp:  [14-17] 15 (10/29 1000) BP: (92-162)/(53-94) 162/94 mmHg (10/29 1000) SpO2:  [90 %-95 %] 95 % (10/29 1000) Arterial Line BP: (109-188)/(47-79) 188/79 mmHg (10/29 1000) FiO2 (%):  [50 %-60 %] 50 % (10/29 0841) Weight:  [60.3 kg (132 lb 15 oz)] 60.3 kg (132 lb 15 oz) (10/29 0426) HEMODYNAMICS:   VENTILATOR SETTINGS: Vent Mode:  [-] PRVC FiO2 (%):  [50 %-60 %] 50 % Set Rate:  [16 bmp] 16 bmp Vt Set:  [450 mL] 450 mL PEEP:  [5 cmH20] 5 cmH20 Plateau Pressure:  [13 cmH20-19 cmH20] 14 cmH20 INTAKE / OUTPUT:  Intake/Output Summary (Last 24 hours) at 08/08/14 1056 Last data filed at 08/08/14 1043  Gross per 24 hour  Intake 2076.27 ml  Output   2925 ml  Net -848.73 ml    PHYSICAL EXAMINATION: General:  Thin chronically ill-appearing 63 yo male, appears older than stated age. No distress Neuro: Sedated on vent, RASS -3, HEENT:  Scattered abrasions on scalp, orally intubated, OGT Cardiovascular: RRR, no murmur or JVD Lungs: Coarse and diminished over bases;  no wheezing Abdomen:  Soft, + bowel sounds  Musculoskeletal:  Intact  Skin: Resolving ecchymoses  LABS:  CBC  Recent Labs Lab 08/05/14 0440 08/06/14 0445 08/07/14 0800  WBC 8.0 5.7 6.6  HGB 14.8 13.3 13.8  HCT 44.1 40.4 42.3  PLT 75* 62* 75*   Coag's  Recent Labs Lab 08/05/14 1010  APTT 31  INR 1.19   BMET  Recent Labs Lab 08/06/14 0445 08/07/14 0800 08/08/14 0725  NA 136* 137 141  K 4.5 5.1 5.2  CL 99 99 98  CO2 28 31 36*  BUN 23 23 28*  CREATININE 0.61 0.49* 0.51  GLUCOSE 178* 140* 140*   Electrolytes  Recent Labs Lab 08/06/14 0445  08/07/14 0410 08/07/14 0800 08/07/14 1700 08/08/14 0435 08/08/14 0725  CALCIUM 7.0*  --   --  7.9*  --   --  8.8  MG 2.6*  < > 2.1  --  1.9 1.8  --   PHOS 2.4  < > 2.7  --  3.8 4.4  --   < > = values in this interval not displayed. Sepsis Markers  Recent Labs Lab 08/06/14 0445  LATICACIDVEN 0.8   ABG  Recent Labs Lab 08/04/14 1012 08/04/14 1304 08/06/14 0203  PHART 7.197* 7.290* 7.322*  PCO2ART 84.9* 61.6* 59.6*  PO2ART 295.0* 110.0* 63.2*   Liver Enzymes  Recent Labs Lab 07/16/2014 1712  AST 23  ALT 6  ALKPHOS 107  BILITOT 1.0  ALBUMIN  3.9   Cardiac Enzymes  Recent Labs Lab 07/29/2014 1715 08/01/2014 2331 08/03/14 0500  TROPONINI  --  <0.30 <0.30  PROBNP 9080.0*  --   --    Glucose  Recent Labs Lab 08/07/14 1209 08/07/14 1553 08/07/14 1920 08/08/14 0007 08/08/14 0349 08/08/14 0821  GLUCAP 193* 147* 117* 126* 141* 138*    Imaging Dg Chest Port 1 View  08/07/2014   CLINICAL DATA:  Acute respiratory failure.  Subsequent encounter.  EXAM: PORTABLE CHEST - 1 VIEW  COMPARISON:  One-view chest 08/06/2014.  FINDINGS: The patient remains intubated. The endotracheal tube terminates 6 cm above the carina. The NG tube is in satisfactory position. A right IJ line is stable. The heart is mildly enlarged. Mild interstitial edema has slightly increased. Small bilateral pleural effusions persist.  Bibasilar airspace disease likely reflects atelectasis. The upper lung fields are clear.  IMPRESSION: 1. Stable and satisfactory positioning of the support apparatus. 2. Slight increase in interstitial edema suggesting congestive heart failure. 3. Bibasilar airspace disease likely reflects atelectasis.   Electronically Signed   By: Gennette Pachris  Mattern M.D.   On: 08/07/2014 08:44  ETT good position. Worsening R>L airspace disease.   ASSESSMENT / PLAN:  PULMONARY OETT 10/25>>> A: Acute hypoxic and hypercarbic respiratory failure -Required intubation  more as a result of a panic attack superimposed on prob ETOH w/d  PNA  AECOPD    P:   Full vent support: 40% / 5 / 16 / 450 Nebs prn wheezing Drop Solumedrol 60 q12h Daily SBT  CARDIOVASCULAR CVL 10/25 >> A-line 10/25 >>  A: Sepsis - resolved off pressors P:  Tele   RENAL A:  Hypokalemia/ hypomag - resolved Hypochloremia - resolved P:   Serial chems  Replete electrolytes prn Lasix 40 daily for neg balance  GASTROINTESTINAL OGT >> A:   Aspiration risk  Protein calorie malnutrition P:   PPI  Ct TFs @ 40cc/hr  HEMATOLOGIC A:   Mild thrombocytopenia - etoh vs. sepsis Ecchymoses  P:  Trend CBC  Monitor signs of bleeding [place] heparin   INFECTIOUS A:   CAP +MRSA by PCR screen  P:   BCx2 10/23>>>ngtd resp viral panel 10/24>>>Negative Abx: azithro 10/23>>>10/28 Abx: rocephin 10/23>>> 10/20 (planned) Contact isolation per protocol  ENDOCRINE A:   Hyperglycemia - stress and steroids P:   SSI  NEUROLOGIC A:   Acute encephalopathy -failed precedex gtt Panic attack DTs  P:   RASS goal: 0 Propofol gtt Cont fentanyl gtt, ativan 2mg  BID, added seroquel 100 bid on 10/28 -monitor qTc Cont thiamine (250mg  IV daily x5 days) and folate   FAMILY  - Updates: wife & DIL at bedside 10/27  - Inter-disciplinary family meet or Palliative Care meeting due by:  10/31   Summary  - Agitation & poor pre-morbid lung function  main issues - added seroquel & maximised ativan to facilitate wean. Diurese gently as Bp permits. Initiated palliative discussion with wife who says 'he wanted us to do everything', Best strategy may be to get pts input regarding reintubation once we reach that point  The patient is critically ill with multiple organ systems failure and requires high complexity decision making for assessment and support, frequent evaluation and titration of therapies, application of advanced monitoring technologies and extensive interpretation of multiple databases. Critical Care Time devoted to patient care services described in this note is 35 minutes.    Oretha MilchALVA,Tycho Cheramie V. MD 08/08/2014 10:56 AM

## 2014-08-08 NOTE — Progress Notes (Signed)
Pt tolerated sedation being turned off for a little over 2 hours and upon wean attempt pt was only breathing 6 bpm. RT put pt back on full support, pt would open eyes but would not track or follow commands. After 2 hours of being calm pt became increasingly agitated, fighting the ventilator, reaching for the tube and sitting up in the bed. Pt would still not track or follow commands at that time. MD made aware and sedation resumed at 50%. Will continue to monitor.

## 2014-08-09 ENCOUNTER — Inpatient Hospital Stay (HOSPITAL_COMMUNITY): Payer: Medicare Other

## 2014-08-09 DIAGNOSIS — J9600 Acute respiratory failure, unspecified whether with hypoxia or hypercapnia: Secondary | ICD-10-CM

## 2014-08-09 LAB — BASIC METABOLIC PANEL
ANION GAP: 8 (ref 5–15)
BUN: 31 mg/dL — ABNORMAL HIGH (ref 6–23)
CHLORIDE: 95 meq/L — AB (ref 96–112)
CO2: 39 meq/L — AB (ref 19–32)
CREATININE: 0.45 mg/dL — AB (ref 0.50–1.35)
Calcium: 9 mg/dL (ref 8.4–10.5)
GFR calc Af Amer: >90 mL/min (ref >90)
GFR calc non Af Amer: >90 mL/min (ref >90)
Glucose, Bld: 110 mg/dL — ABNORMAL HIGH (ref 70–99)
POTASSIUM: 4.6 meq/L (ref 3.7–5.3)
Sodium: 142 mEq/L (ref 137–147)

## 2014-08-09 LAB — CBC
HEMATOCRIT: 46.7 % (ref 39.0–52.0)
HEMOGLOBIN: 14.8 g/dL (ref 13.0–17.0)
MCH: 33.8 pg (ref 26.0–34.0)
MCHC: 31.7 g/dL (ref 30.0–36.0)
MCV: 106.6 fL — ABNORMAL HIGH (ref 78.0–100.0)
Platelets: 128 10*3/uL — ABNORMAL LOW (ref 150–400)
RBC: 4.38 MIL/uL (ref 4.22–5.81)
RDW: 15.6 % — ABNORMAL HIGH (ref 11.5–15.5)
WBC: 9.8 10*3/uL (ref 4.0–10.5)

## 2014-08-09 LAB — CULTURE, BLOOD (ROUTINE X 2)
CULTURE: NO GROWTH
Culture: NO GROWTH

## 2014-08-09 LAB — GLUCOSE, CAPILLARY
GLUCOSE-CAPILLARY: 161 mg/dL — AB (ref 70–99)
GLUCOSE-CAPILLARY: 115 mg/dL — AB (ref 70–99)
Glucose-Capillary: 138 mg/dL — ABNORMAL HIGH (ref 70–99)
Glucose-Capillary: 131 mg/dL — ABNORMAL HIGH (ref 70–99)
Glucose-Capillary: 144 mg/dL — ABNORMAL HIGH (ref 70–99)
Glucose-Capillary: 112 mg/dL — ABNORMAL HIGH (ref 70–99)

## 2014-08-09 MED ORDER — SODIUM CHLORIDE 0.9 % IV SOLN
INTRAVENOUS | Status: DC | PRN
  Administered 2014-08-09: 05:00:00 via INTRAVENOUS
  Administered 2014-08-10: 10 mL/h via INTRAVENOUS
  Administered 2014-08-12: 08:00:00 via INTRAVENOUS

## 2014-08-09 MED ORDER — VITAMIN B-1 100 MG PO TABS
100.0000 mg | ORAL_TABLET | Freq: Every day | ORAL | Status: DC
  Administered 2014-08-09 – 2014-08-21 (×13): 100 mg
  Filled 2014-08-09 (×14): qty 1

## 2014-08-09 MED ORDER — LORAZEPAM 1 MG PO TABS
2.0000 mg | ORAL_TABLET | Freq: Four times a day (QID) | ORAL | Status: DC
  Administered 2014-08-09 – 2014-08-13 (×17): 2 mg
  Filled 2014-08-09 (×18): qty 2

## 2014-08-09 MED ORDER — METHYLPREDNISOLONE SODIUM SUCC 40 MG IJ SOLR
40.0000 mg | Freq: Two times a day (BID) | INTRAMUSCULAR | Status: DC
  Administered 2014-08-09 – 2014-08-17 (×15): 40 mg via INTRAVENOUS
  Filled 2014-08-09 (×18): qty 1

## 2014-08-09 NOTE — Progress Notes (Signed)
MEDICATION RELATED CONSULT NOTE - INITIAL   Pharmacy Consult for Dilantin Indication: history of seizures  No Known Allergies  Patient Measurements: Height: '5\' 6"'  (167.6 cm) Weight: 129 lb 13.6 oz (58.9 kg) IBW/kg (Calculated) : 63.8  Vital Signs: Temp: 97.5 F (36.4 C) (10/30 0800) Temp Source: Oral (10/30 0800) BP: 179/106 mmHg (10/30 1000) Pulse Rate: 114 (10/30 1015) Intake/Output from previous day: 10/29 0701 - 10/30 0700 In: 1947.3 [I.V.:527.3; NG/GT:1370; IV Piggyback:50] Out: 6073 [Urine:3365] Intake/Output from this shift: Total I/O In: 233.7 [I.V.:53.7; NG/GT:180] Out: 90 [Urine:90]  Labs:  Recent Labs  08/07/14 0800 08/07/14 1700 08/08/14 0435 08/08/14 0725 08/08/14 1630 08/09/14 0430  WBC 6.6  --   --   --   --  9.8  HGB 13.8  --   --   --   --  14.8  HCT 42.3  --   --   --   --  46.7  PLT 75*  --   --   --   --  128*  CREATININE 0.49*  --   --  0.51  --  0.45*  MG  --  1.9 1.8  --  1.4*  --   PHOS  --  3.8 4.4  --  4.3  --    Estimated Creatinine Clearance: 76.7 ml/min (by C-G formula based on Cr of 0.45).   Microbiology: Recent Results (from the past 720 hour(s))  CULTURE, BLOOD (ROUTINE X 2)     Status: None   Collection Time    08/01/2014  8:38 PM      Result Value Ref Range Status   Specimen Description BLOOD HAND RIGHT   Final   Special Requests BOTTLES DRAWN AEROBIC AND ANAEROBIC 5CC   Final   Culture  Setup Time     Final   Value: 08/03/2014 01:11     Performed at [facility]   Culture     Final   Value: NO GROWTH 5 DAYS     Performed at [facility]   Report Status 08/09/2014 FINAL   Final  CULTURE, BLOOD (ROUTINE X 2)     Status: None   Collection Time    07/23/2014  8:47 PM      Result Value Ref Range Status   Specimen Description BLOOD HAND LEFT   Final   Special Requests BOTTLES DRAWN AEROBIC AND ANAEROBIC 4CC   Final   Culture  Setup Time     Final   Value: 08/03/2014 01:11     Performed at [facility]   Culture     Final   Value: NO GROWTH 5 DAYS     Performed at [facility]   Report Status 08/09/2014 FINAL   Final  MRSA PCR SCREENING     Status: Abnormal   Collection Time    07/31/2014 10:57 PM      Result Value Ref Range Status   MRSA by PCR POSITIVE (*) NEGATIVE Final   Comment:            The GeneXpert MRSA Assay (FDA     approved for NASAL specimens     only), is one component of a     comprehensive MRSA colonization     surveillance program. It is not     intended to diagnose MRSA     infection nor to guide or     monitor treatment for     MRSA infections.     RESULT  CALLED TO, READ BACK BY AND VERIFIED WITH:     HANCOCK,M RN [phone] AGT 0106 SKEEN,P  RESPIRATORY VIRUS PANEL     Status: None   Collection Time    08/03/14  2:06 PM      Result Value Ref Range Status   Source - RVPAN NASAL WASHINGS   Corrected   Comment: CORRECTED ON 10/26 AT 9924: PREVIOUSLY REPORTED AS NASAL WASHINGS   Respiratory Syncytial Virus A NOT DETECTED   Final   Respiratory Syncytial Virus B NOT DETECTED   Final   Influenza A NOT DETECTED   Final   Influenza B NOT DETECTED   Final   Parainfluenza 1 NOT DETECTED   Final   Parainfluenza 2 NOT DETECTED   Final   Parainfluenza 3 NOT DETECTED   Final   Metapneumovirus NOT DETECTED   Final   Rhinovirus NOT DETECTED   Final   Adenovirus NOT DETECTED   Final   Influenza A H1 NOT DETECTED   Final   Influenza A H3 NOT DETECTED   Final   Comment: (NOTE)           Normal Reference Range for each Analyte: NOT DETECTED     Testing performed using the Luminex xTAG Respiratory Viral Panel test     kit.     The analytical performance characteristics of this assay have been     determined by [facility].  The modifications have not been     cleared or approved by the FDA. This assay has been validated pursuant     to the CLIA regulations and is used for clinical purposes.     Performed at [place],  RESPIRATORY (NON-EXPECTORATED)     Status: None   Collection Time    08/05/14  7:52 PM      Result Value Ref Range Status   Specimen Description TRACHEAL ASPIRATE   Final   Special Requests NONE   Final   Gram Stain     Final   Value: FEW WBC PRESENT,BOTH PMN AND MONONUCLEAR     NO SQUAMOUS EPITHELIAL CELLS SEEN     NO ORGANISMS SEEN     Performed at [facility]   Culture     Final   Value: NO GROWTH 2 DAYS     Performed at [facility]   Report Status 08/08/2014 FINAL   Final    Medical History: Past Medical History  Diagnosis Date  . Neuromyotonia neuropathy  . COPD (chronic obstructive pulmonary disease)   . Hypertension   . Seizures 2 in past.  2012  . Peripheral vascular disease   . GERD (gastroesophageal reflux disease)     Medications:  Prescriptions prior to admission  Medication Sig Dispense Refill  . clonazePAM (KLONOPIN) 0.5 MG tablet Take 0.5 mg by mouth 2 (two) times daily as needed for anxiety.      . Cyanocobalamin (B-12 PO) Take 1 tablet by mouth daily. OTC unknown strength      . esomeprazole (NEXIUM) 40 MG capsule Take 40 mg by mouth daily.      . finasteride (PROSCAR) 5 MG tablet Take 5 mg by mouth daily.      . folic acid (FOLVITE) 268 MCG tablet Take 400 mcg by mouth daily.      . furosemide (LASIX) 20 MG tablet Take 20 mg by mouth as needed for edema.      . gabapentin (NEURONTIN) 600 MG tablet Take  600 mg by mouth 2 (two) times daily.       Marland Kitchen gabapentin (NEURONTIN) 800 MG tablet Take 800 mg by mouth 2 (two) times daily. Also takes 1 tablet around supper and one tablet at bedtime  Takes gabapentin 663m tablet every morning and one 6034mtablet about 5 hours later.      . Marland KitchenORazepam (ATIVAN) 0.5 MG tablet Take 0.5 mg by mouth 4 (four) times daily. Anxiety      . Magnesium 300 MG CAPS Take 300 mg by mouth daily.      . metoprolol succinate (TOPROL-XL) 50 MG 24 hr tablet Take 50 mg by mouth daily. Take with or immediately following a meal.       . phenytoin (DILANTIN) 100 MG ER capsule Take 300 mg by mouth at bedtime.       . potassium chloride SA (K-DUR,KLOR-CON) 20 MEQ tablet Take 20 mEq by mouth daily.      . Marland KitchenyridOXINE (VITAMIN B-6) 50 MG tablet Take 50 mg by mouth daily.      . Tamsulosin HCl (FLOMAX) 0.4 MG CAPS Take 0.4 mg by mouth daily.      . Marland Kitchenhiamine (VITAMIN B-1) 100 MG tablet Take 100 mg by mouth daily.        Assessment: [age]OM with a history of seizures on phenytoin ER 300 mg qHS prior to admission. 10/26 Phenytoin level = 20.7 (Albumin WNL) on Dilantin 100 mg PT q8h. Since tube feeding binds phenytoin, it was changed to IV. Pharmacy consulted to dose phenytoin.  Goal of Therapy:  Maintain control of seizures  Plan:  -Continue phenytoin 100 mg IV q8h -Follow up albumin and phenytoin level on 10/31 -Monitor for signs of phenytoin toxicity and seizures  MeWhitney MusePharmD Candidate 08/09/2014,10:54 AM   I agree with above assessment and plan.  CaSherlon HandingPharmD, BCPS Clinical pharmacist, pager [phone] 88680/30/2015 2:15 PM

## 2014-08-09 NOTE — Significant Event (Signed)
Patient with Rass score of -2 at 0800. Sedation weaned, started with Fentanyl @ 75mcg at 0815am, propofol remained the same then eventually weaned to half (@15mcg ) at 0839.    0850am-Patient woke up, became increasingly agitated, increased WOB, increased HR, increased BP. Patient does not follow commands. Patient's family have been talking to patient, trying to calm patient down. Sedation turned back up-MD Tyson AliasFeinstein made aware. Patient's spouse and children made aware and given updates.   1015am-Patient's family express concerns that patient has not been settled down yet and asked why sedation was off. RN reaffirmed that sedation was never off this morning, was only reduced and since attempt to wean from sedation was not successful for a period of time, sedation has been increased. RN advised that family allow patient to settle down and to limit distraction and noise. Family agreeable. Patient has settled down; Rass a -2 at this time. Will continue to monitor. Aprille Sawhney, Charity fundraiserN.

## 2014-08-09 NOTE — Progress Notes (Signed)
PULMONARY / CRITICAL CARE MEDICINE   Name: Kenneth Bass MRN: [id number] DOB: 1951-01-16    ADMISSION DATE:  2014/05/02 CONSULTATION DATE:  10/24  REFERRING MD :  Criselda PeachesMullen   CHIEF COMPLAINT:  Acute on chronic respiratory failure   INITIAL PRESENTATION:  63 yo M smoker w/ sig h/o COPD, ETOH and benzo dep anxiety. Admitted 10/23 w/ working dx CAP and AECOPD. Developed worsening resp failure and delirium/agitation on 10/24 , intubated on 10/25 for sudden onset bronchospasm.   STUDIES:    SIGNIFICANT EVENTS: 10/23: admitted 10/24: progressive resp failure, placed on BIPAP. Transferred to ICU  10/25: severe anxiety/ agitation. Desaturated and did not improve w/ BIPAP. Required emergent intubation.  10/27: off pressors  SUBJECTIVE:  Blank stare on WUA off propofol gtt Palliative care discussions yesterday - remains full code  VITAL SIGNS: Temp:  [98.4 F (36.9 C)-99.8 F (37.7 C)] 98.6 F (37 C) (10/30 0437) Pulse Rate:  [81-123] 106 (10/30 0600) Resp:  [11-21] 11 (10/30 0600) BP: (94-186)/(52-98) 137/75 mmHg (10/30 0600) SpO2:  [89 %-96 %] 95 % (10/30 0600) Arterial Line BP: (147-188)/(58-79) 188/79 mmHg (10/29 1000) FiO2 (%):  [50 %-60 %] 60 % (10/30 0359) Weight:  [129 lb 13.6 oz (58.9 kg)] 129 lb 13.6 oz (58.9 kg) (10/30 0300) HEMODYNAMICS: CVP:  [6 mmHg-7 mmHg] 7 mmHg VENTILATOR SETTINGS: Vent Mode:  [-] PRVC FiO2 (%):  [50 %-60 %] 60 % Set Rate:  [16 bmp] 16 bmp Vt Set:  [450 mL] 450 mL PEEP:  [5 cmH20] 5 cmH20 Plateau Pressure:  [8 cmH20-17 cmH20] 17 cmH20 INTAKE / OUTPUT:  Intake/Output Summary (Last 24 hours) at 08/09/14 0700 Last data filed at 08/09/14 0600  Gross per 24 hour  Intake 1299.45 ml  Output   3335 ml  Net -2035.55 ml    PHYSICAL EXAMINATION: General: Thin chronically ill-appearing 63 yo male, appears older than stated age. No distress Neuro: Sedated on vent, RASS 2 with wua HEENT:  Scattered abrasions on scalp, orally intubated,  OGT Cardiovascular: RRR, no murmur or JVD Lungs: Coarse and diminished over bases; no wheezing, moving air  Abdomen:  Soft, + bowel sounds  Musculoskeletal:  Intact  Skin: Resolving ecchymoses  LABS:  CBC  Recent Labs Lab 08/06/14 0445 08/07/14 0800 08/09/14 0430  WBC 5.7 6.6 9.8  HGB 13.3 13.8 14.8  HCT 40.4 42.3 46.7  PLT 62* 75* 128*   Coag's  Recent Labs Lab 08/05/14 1010  APTT 31  INR 1.19   BMET  Recent Labs Lab 08/07/14 0800 08/08/14 0725 08/09/14 0430  NA 137 141 142  K 5.1 5.2 4.6  CL 99 98 95*  CO2 31 36* 39*  BUN 23 28* 31*  CREATININE 0.49* 0.51 0.45*  GLUCOSE 140* 140* 110*   Electrolytes  Recent Labs Lab 08/07/14 0800 08/07/14 1700 08/08/14 0435 08/08/14 0725 08/08/14 1630 08/09/14 0430  CALCIUM 7.9*  --   --  8.8  --  9.0  MG  --  1.9 1.8  --  1.4*  --   PHOS  --  3.8 4.4  --  4.3  --    Sepsis Markers  Recent Labs Lab 08/06/14 0445  LATICACIDVEN 0.8   ABG  Recent Labs Lab 08/04/14 1012 08/04/14 1304 08/06/14 0203  PHART 7.197* 7.290* 7.322*  PCO2ART 84.9* 61.6* 59.6*  PO2ART 295.0* 110.0* 63.2*   Liver Enzymes  Recent Labs Lab 2013-10-12 1712  AST 23  ALT 6  ALKPHOS 107  BILITOT 1.0  ALBUMIN  3.9   Cardiac Enzymes  Recent Labs Lab August 09, 2014 1715 2014-08-09 2331 08/03/14 0500  TROPONINI  --  <0.30 <0.30  PROBNP 9080.0*  --   --    Glucose  Recent Labs Lab 08/08/14 0821 08/08/14 1135 08/08/14 1456 08/08/14 2000 08/09/14 0052 08/09/14 0430  GLUCAP 138* 148* 120* 142* 138* 131*    Imaging Dg Chest Port 1 View  08/08/2014   CLINICAL DATA:  Acute respiratory failure  EXAM: PORTABLE CHEST - 1 VIEW  COMPARISON:  08/07/2014  FINDINGS: An endotracheal tube, nasogastric catheter and right jugular central line are again seen and stable in appearance. No pneumothorax is noted. Bibasilar opacities with associated effusions are seen. There has been worsening particularly in the left retrocardiac region. No  other focal abnormality is seen.  IMPRESSION: Increasing bibasilar opacities with associated effusions.   Electronically Signed   By: Alcide Clever M.D.   On: 08/08/2014 07:43  ETT good position. Worsening R>L airspace disease.   ASSESSMENT / PLAN:  PULMONARY OETT 10/25>>> A: Acute hypoxic and hypercarbic respiratory failure -Required intubation  more as a result of a panic attack superimposed on prob ETOH w/d  PNA  AECOPD    P:   Full vent support: 60% / 5 / 16 / 450 Maintain current mV, goal to 50% , if unable then peep to 8  Nebs prn wheezing Steroids weaned Daily SBT, cpap 5, ps 10 consideration, may need ps 15-20 Consider neg balance goals, successful, maintain  CARDIOVASCULAR CVL 10/25 >> A-line 10/25 >> 10/29 A: Sepsis - resolved off pressors P:  Tele  Lasix remain  RENAL A:  Hypokalemia/ hypomag - resolved Hypochloremia Contraction alkalosis  P:   Serial chems  Replete electrolytes prn Lasix 40 daily for neg balance, maintain this No role acetalolamide  GASTROINTESTINAL OGT >> A:   Aspiration risk  Protein calorie malnutrition P:   PPI  Cont TFs @ 40cc/hr, at goal BM assessment  HEMATOLOGIC A:   Mild thrombocytopenia - etoh vs. sepsis - improving Ecchymoses  P:  Trend CBC  Monitor signs of bleeding [place] heparin maintain  INFECTIOUS A:   CAP +MRSA by PCR screen  P:   BCx2 10/23>>>neg resp viral panel 10/24>>>Negative Abx: azithro 10/23>>>10/28 Abx: rocephin 10/23>>> 10/30 (planned) Contact isolation per protocol  ENDOCRINE A:   Hyperglycemia - stress and steroids P:   SSI at goal < 180 Reducing steroids  NEUROLOGIC A:   Acute encephalopathy -failed precedex gtt Panic attack DTs  P:   RASS goal: 0 Propofol gtt, maintain with wua Cont fentanyl gtt, ativan 2mg  up to TID, added seroquel 100 bid on 10/28 Cont thiamine and folate Escalate ativan May need Depakote likely to require trach   FAMILY  - Updates: wife & DIL at  bedside 10/29  - Inter-disciplinary family meet or Palliative Care meeting due by:  10/31   Summary  - Agitation & poor pre-morbid lung function main issues - added seroquel & maximised ativan to facilitate wean. Diurese gently as Bp permits. Initiated palliative discussion with wife who says 'he wanted [place] to do everything', Best strategy may be to get pts input regarding reintubation once we reach that point  Ryan B. Jarvis Newcomer, MD, PGY-2 08/09/2014 7:00 AM  See all changes above i have made I performed personal ccm time 30 min   STAFF NOTE: I, Dr Wilford Sports have personally reviewed patient's available data, including medical history, events of note, physical examination and test results as part of my evaluation.  I have discussed with resident/NP and other care providers such as pharmacist, RN and RRT.  In addition,  I personally evaluated patient and elicited key findings of continued poor weaning, resp failure, neg balance 2 liters.  Rest per NP/medical resident whose note is outlined above and that I agree with and edited in full.  The patient is critically ill with multiple organ systems failure and requires high complexity decision making for assessment and support, frequent evaluation and titration of therapies, application of advanced monitoring technologies and extensive interpretation of multiple databases.   Critical Care Time devoted to patient care services described in this note is  30  Minutes. This time reflects time of care of this signee Dr Rory Percyaniel Bella Brummet. This critical care time does not reflect procedure time, or teaching time or supervisory time of PA/NP/Med student/Med Resident etc but could involve care discussion time    Dr. Joycie Peekaniel Jay Marciana Uplinger, MD, FACP Pulmonary and Critical Care Medicine [facility] Pager [phone]  @TD @ @NOW @

## 2014-08-10 ENCOUNTER — Inpatient Hospital Stay (HOSPITAL_COMMUNITY): Payer: Medicare Other

## 2014-08-10 LAB — GLUCOSE, CAPILLARY
GLUCOSE-CAPILLARY: 119 mg/dL — AB (ref 70–99)
GLUCOSE-CAPILLARY: 133 mg/dL — AB (ref 70–99)
Glucose-Capillary: 122 mg/dL — ABNORMAL HIGH (ref 70–99)
Glucose-Capillary: 158 mg/dL — ABNORMAL HIGH (ref 70–99)
Glucose-Capillary: 156 mg/dL — ABNORMAL HIGH (ref 70–99)
Glucose-Capillary: 105 mg/dL — ABNORMAL HIGH (ref 70–99)

## 2014-08-10 LAB — TRIGLYCERIDES: Triglycerides: 89 mg/dL (ref <150)

## 2014-08-10 LAB — PHENYTOIN LEVEL, TOTAL: Phenytoin Lvl: 22.5 ug/mL — ABNORMAL HIGH (ref 10.0–20.0)

## 2014-08-10 LAB — ALBUMIN: Albumin: 2.8 g/dL — ABNORMAL LOW (ref 3.5–5.2)

## 2014-08-10 NOTE — Progress Notes (Signed)
Pharmacy: Dilantin  63yom on dilantin pta for hx seizures. Resumed here but changed to IV as patient is intubated. Dilantin level on 10/26 was therapeutic with normal albumin. Level today is supratherapeutic at 22.5 which corrects to 34 given low albumin.  Goal: Total dilantin level 10-20  Plan: 1) Hold dilantin 2) Recheck level tomorrow and re-dose if < 20  Louie CasaJennifer Victor Langenbach, PharmD, BCPS 08/10/14, 11:28 AM

## 2014-08-10 NOTE — Progress Notes (Signed)
PULMONARY / CRITICAL CARE MEDICINE   Name: Avon GullyFrancis J Mcnee MRN: [id number] DOB: 05-10-1951    ADMISSION DATE:  19-Sep-2014 CONSULTATION DATE:  10/24  REFERRING MD :  Criselda PeachesMullen   CHIEF COMPLAINT:  Acute on chronic respiratory failure   INITIAL PRESENTATION:  63 yo M smoker w/ sig h/o COPD, ETOH and benzo dep anxiety. Admitted 10/23 w/ working dx CAP and AECOPD. Developed worsening resp failure and delirium/agitation on 10/24 , intubated on 10/25 for sudden onset bronchospasm.   STUDIES:    SIGNIFICANT EVENTS: 10/23: admitted 10/24: progressive resp failure, placed on BIPAP. Transferred to ICU  10/25: severe anxiety/ agitation. Desaturated and did not improve w/ BIPAP. Required emergent intubation.  10/27: off pressors  SUBJECTIVE:  Sedated, some agitation today FiO2 up to 0.60  VITAL SIGNS: Temp:  [98 F (36.7 C)-98.9 F (37.2 C)] 98.9 F (37.2 C) (10/31 1131) Pulse Rate:  [91-121] 92 (10/31 1220) Resp:  [13-25] 18 (10/31 1220) BP: (76-154)/(48-95) 82/53 mmHg (10/31 1100) SpO2:  [87 %-99 %] 93 % (10/31 1220) FiO2 (%):  [50 %-60 %] 60 % (10/31 1220) Weight:  [57 kg (125 lb 10.6 oz)] 57 kg (125 lb 10.6 oz) (10/31 0500) HEMODYNAMICS: CVP:  [6 mmHg-9 mmHg] 9 mmHg VENTILATOR SETTINGS: Vent Mode:  [-] PRVC FiO2 (%):  [50 %-60 %] 60 % Set Rate:  [16 bmp] 16 bmp Vt Set:  [450 mL] 450 mL PEEP:  [8 cmH20] 8 cmH20 Plateau Pressure:  [9 cmH20-18 cmH20] 18 cmH20 INTAKE / OUTPUT:  Intake/Output Summary (Last 24 hours) at 08/10/14 1334 Last data filed at 08/10/14 1100  Gross per 24 hour  Intake 2054.13 ml  Output   2105 ml  Net -50.87 ml    PHYSICAL EXAMINATION: General: Thin chronically ill-appearing 63 yo male, appears older than stated age. No distress Neuro: Sedated on vent, RASS -1  HEENT:  Scattered abrasions on scalp, orally intubated, OGT Cardiovascular: RRR, no murmur or JVD Lungs: Coarse and diminished over bases; no wheezing, moving air  Abdomen:  Soft, +  bowel sounds  Musculoskeletal:  Intact  Skin: Resolving ecchymoses  LABS:  CBC  Recent Labs Lab 08/06/14 0445 08/07/14 0800 08/09/14 0430  WBC 5.7 6.6 9.8  HGB 13.3 13.8 14.8  HCT 40.4 42.3 46.7  PLT 62* 75* 128*   Coag's  Recent Labs Lab 08/05/14 1010  APTT 31  INR 1.19   BMET  Recent Labs Lab 08/07/14 0800 08/08/14 0725 08/09/14 0430  NA 137 141 142  K 5.1 5.2 4.6  CL 99 98 95*  CO2 31 36* 39*  BUN 23 28* 31*  CREATININE 0.49* 0.51 0.45*  GLUCOSE 140* 140* 110*   Electrolytes  Recent Labs Lab 08/07/14 0800 08/07/14 1700 08/08/14 0435 08/08/14 0725 08/08/14 1630 08/09/14 0430  CALCIUM 7.9*  --   --  8.8  --  9.0  MG  --  1.9 1.8  --  1.4*  --   PHOS  --  3.8 4.4  --  4.3  --    Sepsis Markers  Recent Labs Lab 08/06/14 0445  LATICACIDVEN 0.8   ABG  Recent Labs Lab 08/04/14 1012 08/04/14 1304 08/06/14 0203  PHART 7.197* 7.290* 7.322*  PCO2ART 84.9* 61.6* 59.6*  PO2ART 295.0* 110.0* 63.2*   Liver Enzymes  Recent Labs Lab 08/10/14 0423  ALBUMIN 2.8*   Cardiac Enzymes No results found for this basename: TROPONINI, PROBNP,  in the last 168 hours Glucose  Recent Labs Lab 08/09/14 1532 08/09/14 1954  08/10/14 0027 08/10/14 0436 08/10/14 0726 08/10/14 1118  GLUCAP 112* 115* 122* 119* 133* 158*    Imaging Dg Chest Port 1 View  08/09/2014   CLINICAL DATA:  63 year old male with acute respiratory failure. Community-acquired pneumonia. Initial encounter.  EXAM: PORTABLE CHEST - 1 VIEW  COMPARISON:  08/08/2014 and earlier.  FINDINGS: Portable AP semi upright view at a 0518 hr. Endotracheal tube is stable. Enteric tube is stable, side hole at the level of the proximal stomach. Stable cardiac size and mediastinal contours. Large lung volumes. Decreased evidence of bilateral pleural effusions. Reticular nodular density in bilateral upper and lower lungs. No areas of worsening ventilation.  IMPRESSION: 1.  Stable lines and tubes. 2.  Bilateral multilobar pneumonia, no interval worsening ventilation. 3. Bilateral pleural effusions appear regressed.   Electronically Signed   By: Augusto GambleLee  Hall M.D.   On: 08/09/2014 07:39    ASSESSMENT / PLAN:  PULMONARY OETT 10/25>>>  A: Acute hypoxic and hypercarbic respiratory failure -Required intubation  more as a result of a panic attack superimposed on prob ETOH w/d  PNA  AE-COPD    P:   Increase Vt to 600cc given hx hyperinflation, COPD Wean Fio2 as able Nebs prn wheezing Steroids weaned Diuresis as able  CARDIOVASCULAR CVL 10/25 >> A-line 10/25 >> 10/29 A: Sepsis - resolved off pressors P:  Tele  Diuresis as able  RENAL A:  Hypokalemia/ hypomag - resolved Hypochloremia Contraction alkalosis  P:   Follow BMP Replete electrolytes prn Lasix 40 daily for neg balance, maintain this  GASTROINTESTINAL OGT >> A:   Aspiration risk  Protein calorie malnutrition P:   PPI  Cont TFs @ 40cc/hr, at goal BM assessment  HEMATOLOGIC A:   Mild thrombocytopenia - etoh vs. sepsis - improving Ecchymoses  P:  Trend CBC  Monitor signs of bleeding  heparin   INFECTIOUS A:   CAP +MRSA by PCR screen  P:   BCx2 10/23>>>neg resp viral panel 10/24>>>Negative Abx: azithro 10/23>>>10/28 Abx: rocephin 10/23>>> 10/30 Contact isolation per protocol  ENDOCRINE A:   Hyperglycemia - stress and steroids P:   SSI at goal < 180 Reducing steroids  NEUROLOGIC A:   Acute encephalopathy -failed precedex gtt Panic attack DTs  P:   RASS goal: 0 to -1 Propofol gtt, maintain with wua Cont fentanyl gtt, ativan 2mg  up to TID, added seroquel 100 bid on 10/28 Cont thiamine and folate   FAMILY  - Updates: wife & DIL at bedside 10/29, 10/31 by RB  - Inter-disciplinary family meet or Palliative Care meeting due by:  10/31   Independent CC time 35 minutes   Levy Pupaobert Eliska Hamil, MD, PhD 08/10/2014, 2:01 PM [facility] [phone] or if no answer  [phone]

## 2014-08-11 ENCOUNTER — Inpatient Hospital Stay (HOSPITAL_COMMUNITY): Payer: Medicare Other

## 2014-08-11 LAB — BASIC METABOLIC PANEL
BUN: 39 mg/dL — ABNORMAL HIGH (ref 6–23)
CALCIUM: 8.7 mg/dL (ref 8.4–10.5)
CREATININE: 0.53 mg/dL (ref 0.50–1.35)
Chloride: 89 mEq/L — ABNORMAL LOW (ref 96–112)
GFR calc Af Amer: >90 mL/min (ref >90)
GFR calc non Af Amer: >90 mL/min (ref >90)
Glucose, Bld: 126 mg/dL — ABNORMAL HIGH (ref 70–99)
Potassium: 3.5 mEq/L — ABNORMAL LOW (ref 3.7–5.3)
Sodium: 138 mEq/L (ref 137–147)

## 2014-08-11 LAB — GLUCOSE, CAPILLARY
GLUCOSE-CAPILLARY: 143 mg/dL — AB (ref 70–99)
GLUCOSE-CAPILLARY: 167 mg/dL — AB (ref 70–99)
Glucose-Capillary: 148 mg/dL — ABNORMAL HIGH (ref 70–99)
Glucose-Capillary: 134 mg/dL — ABNORMAL HIGH (ref 70–99)
Glucose-Capillary: 130 mg/dL — ABNORMAL HIGH (ref 70–99)

## 2014-08-11 LAB — PHENYTOIN LEVEL, TOTAL: PHENYTOIN LVL: 8.3 ug/mL — AB (ref 10.0–20.0)

## 2014-08-11 LAB — CBC
HEMATOCRIT: 35.2 % — AB (ref 39.0–52.0)
Hemoglobin: 11.6 g/dL — ABNORMAL LOW (ref 13.0–17.0)
MCH: 33.4 pg (ref 26.0–34.0)
MCHC: 33 g/dL (ref 30.0–36.0)
MCV: 101.4 fL — ABNORMAL HIGH (ref 78.0–100.0)
Platelets: 133 10*3/uL — ABNORMAL LOW (ref 150–400)
RBC: 3.47 MIL/uL — AB (ref 4.22–5.81)
RDW: 15.5 % (ref 11.5–15.5)
WBC: 10.4 10*3/uL (ref 4.0–10.5)

## 2014-08-11 LAB — MAGNESIUM: Magnesium: 1.3 mg/dL — ABNORMAL LOW (ref 1.5–2.5)

## 2014-08-11 LAB — PHOSPHORUS: PHOSPHORUS: 3.5 mg/dL (ref 2.3–4.6)

## 2014-08-11 MED ORDER — ALBUTEROL SULFATE (2.5 MG/3ML) 0.083% IN NEBU: 2.5000 mg | INHALATION_SOLUTION | RESPIRATORY_TRACT | Status: DC | PRN

## 2014-08-11 MED ORDER — ADULT MULTIVITAMIN LIQUID CH
5.0000 mL | Freq: Every day | ORAL | Status: DC
  Administered 2014-08-11 – 2014-08-21 (×11): 5 mL via ORAL
  Filled 2014-08-11 (×12): qty 5

## 2014-08-11 MED ORDER — PHENYTOIN SODIUM 50 MG/ML IJ SOLN
100.0000 mg | Freq: Three times a day (TID) | INTRAMUSCULAR | Status: DC
  Administered 2014-08-11 – 2014-08-16 (×15): 100 mg via INTRAVENOUS
  Filled 2014-08-11 (×22): qty 2

## 2014-08-11 MED ORDER — QUETIAPINE FUMARATE 50 MG PO TABS
150.0000 mg | ORAL_TABLET | Freq: Two times a day (BID) | ORAL | Status: DC
  Administered 2014-08-11 – 2014-08-12 (×2): 150 mg
  Filled 2014-08-11 (×3): qty 1

## 2014-08-11 MED ORDER — SODIUM CHLORIDE 0.9 % IV BOLUS (SEPSIS)
750.0000 mL | Freq: Once | INTRAVENOUS | Status: AC
  Administered 2014-08-11: 750 mL via INTRAVENOUS

## 2014-08-11 MED ORDER — IPRATROPIUM-ALBUTEROL 0.5-2.5 (3) MG/3ML IN SOLN
3.0000 mL | Freq: Four times a day (QID) | RESPIRATORY_TRACT | Status: DC
  Administered 2014-08-11 – 2014-08-21 (×40): 3 mL via RESPIRATORY_TRACT
  Filled 2014-08-11 (×40): qty 3

## 2014-08-11 MED ORDER — POTASSIUM CHLORIDE 20 MEQ/15ML (10%) PO LIQD
40.0000 meq | Freq: Once | ORAL | Status: AC
  Administered 2014-08-11: 40 meq
  Filled 2014-08-11 (×2): qty 30

## 2014-08-11 MED ORDER — DOCUSATE SODIUM 50 MG/5ML PO LIQD
100.0000 mg | Freq: Every day | ORAL | Status: DC
  Administered 2014-08-11 – 2014-08-21 (×11): 100 mg via ORAL
  Filled 2014-08-11 (×11): qty 10

## 2014-08-11 MED ORDER — MAGNESIUM SULFATE 40 MG/ML IJ SOLN
2.0000 g | Freq: Once | INTRAMUSCULAR | Status: AC
  Administered 2014-08-11: 2 g via INTRAVENOUS
  Filled 2014-08-11 (×2): qty 50

## 2014-08-11 NOTE — Progress Notes (Signed)
eLink Physician-Brief Progress Note Patient Name: Kenneth Bass DOB: 08/13/1951 MRN: [id number]   Date of Service  08/11/2014  HPI/Events of Note  Worsening hypoxemia  eICU Interventions  Vent changes made     Intervention Category Major Interventions: Respiratory failure - evaluation and management  Billy FischerDavid Jerryl Holzhauer 08/11/2014, 8:58 PM

## 2014-08-11 NOTE — Progress Notes (Signed)
PULMONARY / CRITICAL CARE MEDICINE   Name: Kenneth Bass MRN: [id number] DOB: [birth year]    ADMISSION DATE:  07/26/2014 CONSULTATION DATE:  10/24  REFERRING MD :  Criselda Peaches   CHIEF COMPLAINT:  Acute on chronic respiratory failure   INITIAL PRESENTATION: 63 y/o M smoker with PMH of COPD, ETOH and benzo dep anxiety. Admitted 10/23 w/ working dx CAP and AECOPD. Developed worsening resp failure and delirium/agitation on 10/24 , intubated on 10/25 for sudden onset bronchospasm.   STUDIES:    SIGNIFICANT EVENTS: 10/23  admitted 10/24  progressive resp failure, placed on BIPAP. Transferred to ICU  10/25  severe anxiety/ agitation. Desaturated and did not improve w/ BIPAP. Required emergent intubation.  10/27  off pressors 10/31  Sedate, intermittent agitation, fiO2 to 60% 11/01  Intermittent agitation, high dose fentanyl / propofol   SUBJECTIVE: RN reports intermittent agitation, 50% fio2, high dose fent/prop   VITAL SIGNS: Temp:  [98.9 F (37.2 C)-100.2 F (37.9 C)] 99.8 F (37.7 C) (11/01 0500) Pulse Rate:  [88-124] 88 (11/01 1000) Resp:  [11-24] 12 (11/01 1000) BP: (75-158)/(44-100) 90/53 mmHg (11/01 1000) SpO2:  [86 %-99 %] 96 % (11/01 1000) FiO2 (%):  [50 %-60 %] 50 % (11/01 0938) Weight:  [128 lb 8.5 oz (58.3 kg)] 128 lb 8.5 oz (58.3 kg) (11/01 0440)  HEMODYNAMICS: CVP:  [5 mmHg-63 mmHg] 63 mmHg  VENTILATOR SETTINGS: Vent Mode:  [-] PRVC FiO2 (%):  [50 %-60 %] 50 % Set Rate:  [12 bmp-16 bmp] 12 bmp Vt Set:  [450 mL-600 mL] 600 mL PEEP:  [8 cmH20] 8 cmH20 Plateau Pressure:  [18 cmH20-21 cmH20] 18 cmH20  INTAKE / OUTPUT:  Intake/Output Summary (Last 24 hours) at 08/11/14 1041 Last data filed at 08/11/14 0800  Gross per 24 hour  Intake 1588.28 ml  Output   2250 ml  Net -661.72 ml    PHYSICAL EXAMINATION: General: Thin chronically ill-appearing male, appears older than stated age. No distress Neuro: Sedated on vent, RASS -1  HEENT:  Scattered abrasions on  scalp, orally intubated, OGT Cardiovascular: RRR, no murmur or JVD Lungs: Coarse and diminished over bases; no wheezing, moving air  Abdomen:  Soft, + bowel sounds  Musculoskeletal:  Intact  Skin: Resolving ecchymoses  LABS:  CBC  Recent Labs Lab 08/07/14 0800 08/09/14 0430 08/11/14 0503  WBC 6.6 9.8 10.4  HGB 13.8 14.8 11.6*  HCT 42.3 46.7 35.2*  PLT 75* 128* 133*   Coag's  Recent Labs Lab 08/05/14 1010  APTT 31  INR 1.19   BMET  Recent Labs Lab 08/08/14 0725 08/09/14 0430 08/11/14 0503  NA 141 142 138  K 5.2 4.6 3.5*  CL 98 95* 89*  CO2 36* 39* >45*  BUN 28* 31* 39*  CREATININE 0.51 0.45* 0.53  GLUCOSE 140* 110* 126*   Electrolytes  Recent Labs Lab 08/08/14 0435 08/08/14 0725 08/08/14 1630 08/09/14 0430 08/11/14 0503  CALCIUM  --  8.8  --  9.0 8.7  MG 1.8  --  1.4*  --  1.3*  PHOS 4.4  --  4.3  --  3.5   Sepsis Markers  Recent Labs Lab 08/06/14 0445  LATICACIDVEN 0.8   ABG  Recent Labs Lab 08/04/14 1304 08/06/14 0203  PHART 7.290* 7.322*  PCO2ART 61.6* 59.6*  PO2ART 110.0* 63.2*   Liver Enzymes  Recent Labs Lab 08/10/14 0423  ALBUMIN 2.8*   Cardiac Enzymes No results for input(s): TROPONINI, PROBNP in the last 168 hours. Glucose  Recent  Labs Lab 08/10/14 0726 08/10/14 1118 08/10/14 1530 08/10/14 1955 08/10/14 2350 08/11/14 0420  GLUCAP 133* 158* 156* 105* 143* 148*    Imaging Dg Chest Port 1 View  08/10/2014   CLINICAL DATA:  Acute respiratory acidosis; hypoxia  EXAM: PORTABLE CHEST - 1 VIEW  COMPARISON:  August 09, 2014.  FINDINGS: Endotracheal tube tip is 6.7 cm above carina. Nasogastric tube tip and side port are below the diaphragm. Central catheter tip is in the superior vena cava. No pneumothorax. There is underlying emphysema. There is cardiomegaly with generalized interstitial edema and small bilateral effusions. There is airspace consolidation in the left lower lobe. No adenopathy. No bone lesions. There is  mild mid thoracic dextroscoliosis.  IMPRESSION: Tube and catheter positions as described without pneumothorax. Evidence of congestive heart failure superimposed on emphysematous change. Question superimposed pneumonia left lower lobe with consolidation in this area.   Electronically Signed   By: Bretta BangWilliam  Woodruff M.D.   On: 08/10/2014 14:24    ASSESSMENT / PLAN:  PULMONARY OETT 10/25>>>  A: Acute hypoxic and hypercarbic respiratory failure - Required intubation more as a result of a panic attack superimposed on prob ETOH w/d  PNA  AE-COPD - likely severe COPD P:   Increase Vt to 600cc given hx hyperinflation, COPD Wean Fio2 as able Duoneb Q6 with Q3 PRN albuterol  Steroids 40 mg Q12 Lasix 40 QD Will need O2 assessment and decision regarding home regimen upon discharge (on no meds PTA)  CARDIOVASCULAR CVL 10/25 >> A-line 10/25 >> 10/29 A:  Sepsis - resolved off pressors P:  Tele monitoring Diuresis as able PRN EKG with seroquel  RENAL A:  Hypokalemia/ hypomag - resolved Hypochloremia Contraction alkalosis  P:   Follow BMP Replete electrolytes as indicated, K/Mag 11/1 Lasix 40 daily for neg balance  GASTROINTESTINAL OGT >> A:   Aspiration risk  Protein calorie malnutrition P:   PPI  Cont TFs @ 40cc/hr, at goal Colace QD, hold for diarrhea   HEMATOLOGIC A:   Mild thrombocytopenia - etoh vs. sepsis - improving Ecchymosis P:  Trend CBC  Monitor signs of bleeding [place] heparin   INFECTIOUS A:   CAP +MRSA by PCR screen P:   BCx2 10/23>>>neg resp viral panel 10/24>>>Negative Abx: azithro 10/23>>>10/28 Abx: rocephin 10/23>>> 10/30 Contact isolation per protocol  ENDOCRINE A:   Hyperglycemia - stress and steroids P:   SSI at goal < 180 Reduce steroids 11/1  NEUROLOGIC A:   Acute encephalopathy - failed precedex gtt Panic attack DTs  P:   RASS goal: 0 to -1 Propofol gtt, maintain with wua Cont fentanyl gtt, ativan 2mg  up to TID,  Increased  seroquel 150 bid on 11/1 Cont thiamine, folate, MVI Monitor for DT's   FAMILY  - Updates: wife & daughter-in-law at bedside 10/29, 10/31 by RB  - Inter-disciplinary family meet or Palliative Care meeting:  Discussed with family regarding goals of care.  They indicate he would want tracheostomy if needed.  Currently day 7 of ETT.  Will adjust oral meds in the hopes he will be able to participate with weaning.  Concerned he will have difficulty with weaning from mechanical vent and long term prognosis with severe COPD & ETOH abuse hx.    Canary BrimBrandi Ollis, NP-C  Pulmonary & Critical Care Pgr: [phone] or [phone]  Attending Note:  I have examined the patient, reviewed the notes, labs, studies. I have discussed the patient with B Ollis. I agree with the data, assessment and plans as amended  by me in the note above. The patient is critically ill with multiple organ systems failure and requires high complexity decision making for assessment and support, frequent evaluation and titration of therapies, application of advanced monitoring technologies and extensive interpretation of multiple databases. My critical care time is 32 minutes independent of procedures or care given by other providers  Levy Pupaobert Haru Anspaugh, MD, PhD 08/11/2014, 12:53 PM [facility] [phone] or if no answer [phone]

## 2014-08-11 NOTE — Progress Notes (Signed)
eLink Physician-Brief Progress Note Patient Name: Avon GullyFrancis J Meinhart DOB: 1950/12/20 MRN: [id number]   Date of Service  08/11/2014  HPI/Events of Note  Hypotension, CVP 7  eICU Interventions  DC lasix NS bolus     Intervention Category Major Interventions: Hypotension - evaluation and management  Billy FischerDavid Simonds 08/11/2014, 10:12 PM

## 2014-08-11 NOTE — Progress Notes (Signed)
Pharmacy: Dilantin  63yom on dilantin pta for hx seizures. Resumed here but changed to IV as patient is intubated. Dilantin level on 10/26 was therapeutic with normal albumin. Level 10/31 was supratherapeutic and dose held. Level today is 8.3 which corrects to 12.6 and is therapeutic. Will resume home dose.  Goal: Total dilantin level 10-20  Plan: 1) Resume phenytoin 100mg  IV q8  Louie CasaJennifer Carli Lefevers, PharmD, BCPS 08/11/14, 1:14 PM

## 2014-08-11 DEATH — deceased

## 2014-08-12 ENCOUNTER — Inpatient Hospital Stay (HOSPITAL_COMMUNITY): Payer: Medicare Other

## 2014-08-12 LAB — CBC
HCT: 33.7 % — ABNORMAL LOW (ref 39.0–52.0)
HEMOGLOBIN: 10.9 g/dL — AB (ref 13.0–17.0)
MCH: 33.1 pg (ref 26.0–34.0)
MCHC: 32.3 g/dL (ref 30.0–36.0)
MCV: 102.4 fL — AB (ref 78.0–100.0)
Platelets: 141 10*3/uL — ABNORMAL LOW (ref 150–400)
RBC: 3.29 MIL/uL — ABNORMAL LOW (ref 4.22–5.81)
RDW: 15.5 % (ref 11.5–15.5)
WBC: 10.7 10*3/uL — ABNORMAL HIGH (ref 4.0–10.5)

## 2014-08-12 LAB — BASIC METABOLIC PANEL
Anion gap: 5 (ref 5–15)
BUN: 39 mg/dL — ABNORMAL HIGH (ref 6–23)
CHLORIDE: 92 meq/L — AB (ref 96–112)
CO2: 42 mEq/L (ref 19–32)
CREATININE: 0.5 mg/dL (ref 0.50–1.35)
Calcium: 8.6 mg/dL (ref 8.4–10.5)
GLUCOSE: 141 mg/dL — AB (ref 70–99)
POTASSIUM: 3.9 meq/L (ref 3.7–5.3)
Sodium: 139 mEq/L (ref 137–147)

## 2014-08-12 LAB — GLUCOSE, CAPILLARY
GLUCOSE-CAPILLARY: 186 mg/dL — AB (ref 70–99)
GLUCOSE-CAPILLARY: 149 mg/dL — AB (ref 70–99)
Glucose-Capillary: 142 mg/dL — ABNORMAL HIGH (ref 70–99)
Glucose-Capillary: 154 mg/dL — ABNORMAL HIGH (ref 70–99)
Glucose-Capillary: 154 mg/dL — ABNORMAL HIGH (ref 70–99)
Glucose-Capillary: 131 mg/dL — ABNORMAL HIGH (ref 70–99)
Glucose-Capillary: 163 mg/dL — ABNORMAL HIGH (ref 70–99)

## 2014-08-12 MED ORDER — MIDAZOLAM HCL 2 MG/2ML IJ SOLN
2.0000 mg | INTRAMUSCULAR | Status: DC | PRN
  Administered 2014-08-12 – 2014-08-21 (×7): 2 mg via INTRAVENOUS
  Filled 2014-08-12 (×6): qty 2

## 2014-08-12 MED ORDER — MIDAZOLAM HCL 2 MG/2ML IJ SOLN
2.0000 mg | INTRAMUSCULAR | Status: DC | PRN
  Administered 2014-08-20 (×2): 2 mg via INTRAVENOUS
  Filled 2014-08-12 (×3): qty 2

## 2014-08-12 MED ORDER — FUROSEMIDE 10 MG/ML IJ SOLN
20.0000 mg | Freq: Once | INTRAMUSCULAR | Status: AC
  Administered 2014-08-12: 20 mg via INTRAVENOUS
  Filled 2014-08-12: qty 2

## 2014-08-12 MED ORDER — VITAL HIGH PROTEIN PO LIQD
1000.0000 mL | ORAL | Status: DC
  Administered 2014-08-13 – 2014-08-15 (×4): 1000 mL
  Administered 2014-08-15 – 2014-08-16 (×2)
  Administered 2014-08-16 – 2014-08-20 (×7): 1000 mL
  Filled 2014-08-12 (×11): qty 1000

## 2014-08-12 MED ORDER — QUETIAPINE FUMARATE 200 MG PO TABS
200.0000 mg | ORAL_TABLET | Freq: Two times a day (BID) | ORAL | Status: DC
  Administered 2014-08-12 – 2014-08-13 (×2): 200 mg
  Filled 2014-08-12 (×3): qty 1

## 2014-08-12 MED ORDER — SODIUM CHLORIDE 0.9 % IJ SOLN
10.0000 mL | Freq: Two times a day (BID) | INTRAMUSCULAR | Status: DC
  Administered 2014-08-12 – 2014-08-20 (×14): 10 mL via INTRAVENOUS

## 2014-08-12 MED ORDER — MAGNESIUM SULFATE 2 GM/50ML IV SOLN
2.0000 g | Freq: Once | INTRAVENOUS | Status: AC
  Administered 2014-08-12: 2 g via INTRAVENOUS

## 2014-08-12 MED ORDER — SODIUM CHLORIDE 0.9 % IV SOLN
0.0000 mg/h | INTRAVENOUS | Status: DC
  Administered 2014-08-12: 2 mg/h via INTRAVENOUS
  Administered 2014-08-13: 7 mg/h via INTRAVENOUS
  Administered 2014-08-13: 6 mg/h via INTRAVENOUS
  Administered 2014-08-13: 8 mg/h via INTRAVENOUS
  Administered 2014-08-13: 5 mg/h via INTRAVENOUS
  Administered 2014-08-14: 2 mg/h via INTRAVENOUS
  Administered 2014-08-14: 8 mg/h via INTRAVENOUS
  Filled 2014-08-12 (×7): qty 10

## 2014-08-12 MED ORDER — SODIUM CHLORIDE 0.9 % IJ SOLN: 10.0000 mL | INTRAMUSCULAR | Status: DC | PRN

## 2014-08-12 MED ORDER — DEXMEDETOMIDINE HCL IN NACL 200 MCG/50ML IV SOLN
0.0000 ug/kg/h | INTRAVENOUS | Status: DC
  Administered 2014-08-12: 0.5 ug/kg/h via INTRAVENOUS
  Administered 2014-08-12: 0.4 ug/kg/h via INTRAVENOUS
  Administered 2014-08-12: 0.7 ug/kg/h via INTRAVENOUS
  Administered 2014-08-13: 0.9 ug/kg/h via INTRAVENOUS
  Administered 2014-08-13: 1.051 ug/kg/h via INTRAVENOUS
  Administered 2014-08-13: 0.5 ug/kg/h via INTRAVENOUS
  Administered 2014-08-13: 1.2 ug/kg/h via INTRAVENOUS
  Administered 2014-08-13: 1.121 ug/kg/h via INTRAVENOUS
  Filled 2014-08-12 (×8): qty 50

## 2014-08-12 NOTE — Progress Notes (Signed)
eLink Physician-Brief Progress Note Patient Name: Kenneth GullyFrancis J Ellingsen DOB: 12-06-1950 MRN: [id number]   Date of Service  08/12/2014  HPI/Events of Note  Agitated on precedex gtt He has failed precedex twice earlier last week  eICU Interventions  Start versed gtt Increase seroquel to 200 bid     Intervention Category Major Interventions: Delirium, psychosis, severe agitation - evaluation and management  Millenia Waldvogel V. 08/12/2014, 9:22 PM

## 2014-08-12 NOTE — Progress Notes (Signed)
PULMONARY / CRITICAL CARE MEDICINE   Name: Kenneth Bass MRN: [id number] DOB: Mar 07, 1951    ADMISSION DATE:  07/21/2014 CONSULTATION DATE:  10/24  REFERRING MD:  Criselda Peaches   CHIEF COMPLAINT:  Acute on chronic respiratory failure   INITIAL PRESENTATION: 63 y/o M smoker with PMH of COPD, ETOH and benzo dep anxiety. Admitted 10/23 w/ working dx CAP and AECOPD. Developed worsening resp failure and delirium/agitation on 10/24, intubated on 10/25 for sudden onset bronchospasm.   SIGNIFICANT EVENTS/STUDIES: 10/23  admitted 10/24  progressive resp failure, placed on BIPAP. Transferred to ICU  10/25  severe anxiety/ agitation. Desaturated and did not improve w/ BIPAP. Required emergent intubation.  10/27  off pressors 10/31  Sedate, intermittent agitation, fiO2 to 60% 11/01  Intermittent agitation, high dose fentanyl / propofol   SUBJECTIVE/OVERNIGHT EVENTS:  RN reports status about same as yesterday. Overnight was hypotensive, lasix d/c'd given NS bolus. Vent rate increased 12 to 20.  VITAL SIGNS: Temp:  [97.6 F (36.4 C)-99.5 F (37.5 C)] 99.5 F (37.5 C) (11/02 0834) Pulse Rate:  [87-118] 99 (11/02 0910) Resp:  [12-27] 20 (11/02 0910) BP: (75-188)/(41-128) 166/66 mmHg (11/02 0910) SpO2:  [85 %-99 %] 95 % (11/02 0910) FiO2 (%):  [40 %-100 %] 60 % (11/02 0913) Weight:  [57.1 kg (125 lb 14.1 oz)] 57.1 kg (125 lb 14.1 oz) (11/02 0500)  HEMODYNAMICS: CVP:  [7 mmHg-63 mmHg] 9 mmHg  VENTILATOR SETTINGS: Vent Mode:  [-] PRVC FiO2 (%):  [40 %-100 %] 60 % Set Rate:  [12 bmp-20 bmp] 20 bmp Vt Set:  [500 mL-600 mL] 500 mL PEEP:  [5 cmH20-10 cmH20] 10 cmH20 Plateau Pressure:  [18 cmH20-26 cmH20] 18 cmH20  INTAKE / OUTPUT:  Intake/Output Summary (Last 24 hours) at 08/12/14 4098 Last data filed at 08/12/14 0900  Gross per 24 hour  Intake 3306.69 ml  Output   1930 ml  Net 1376.69 ml    PHYSICAL EXAMINATION: General: Thin chronically ill-appearing male, appears older than stated  age. No distress Neuro: Sedated on vent, RASS -1  HEENT:  Scattered abrasions on scalp, orally intubated, OGT Cardiovascular: RRR, no murmur or JVD Lungs: Coarse and diminished over bases; no wheezing, moving air  Abdomen:  Soft, + bowel sounds. Mild presacral edema. Musculoskeletal:  Intact. No LE edema. Skin: Resolving ecchymoses  LABS: PULMONARY  Recent Labs Lab 08/06/14 0203  PHART 7.322*  PCO2ART 59.6*  PO2ART 63.2*  HCO3 30.0*  TCO2 31.8  O2SAT 90.4    CBC  Recent Labs Lab 08/09/14 0430 08/11/14 0503 08/12/14 0414  HGB 14.8 11.6* 10.9*  HCT 46.7 35.2* 33.7*  WBC 9.8 10.4 10.7*  PLT 128* 133* 141*    COAGULATION No results for input(s): INR in the last 168 hours.  CARDIAC  No results for input(s): TROPONINI in the last 168 hours. No results for input(s): PROBNP in the last 168 hours.   CHEMISTRY  Recent Labs Lab 08/07/14 0410 08/07/14 0800 08/07/14 1700 08/08/14 0435 08/08/14 0725 08/08/14 1630 08/09/14 0430 08/11/14 0503 08/12/14 0414  NA  --  137  --   --  141  --  142 138 139  K  --  5.1  --   --  5.2  --  4.6 3.5* 3.9  CL  --  99  --   --  98  --  95* 89* 92*  CO2  --  31  --   --  36*  --  39* >45* 42*  GLUCOSE  --  140*  --   --  140*  --  110* 126* 141*  BUN  --  23  --   --  28*  --  31* 39* 39*  CREATININE  --  0.49*  --   --  0.51  --  0.45* 0.53 0.50  CALCIUM  --  7.9*  --   --  8.8  --  9.0 8.7 8.6  MG 2.1  --  1.9 1.8  --  1.4*  --  1.3*  --   PHOS 2.7  --  3.8 4.4  --  4.3  --  3.5  --    Estimated Creatinine Clearance: 74.3 mL/min (by C-G formula based on Cr of 0.5).   LIVER  Recent Labs Lab 08/10/14 0423  ALBUMIN 2.8*     INFECTIOUS  Recent Labs Lab 08/06/14 0445  LATICACIDVEN 0.8     ENDOCRINE CBG (last 3)   Recent Labs  08/11/14 1954 08/12/14 0019 08/12/14 0410  GLUCAP 167* 154* 142*         IMAGING x48h Dg Chest Port 1 View  08/12/2014   CLINICAL DATA:  Respiratory failure.  EXAM:  PORTABLE CHEST - 1 VIEW  COMPARISON:  08/11/2014.  FINDINGS: Endotracheal tube, NG tube, right IJ line stable position. Cardiomegaly with diffuse pulmonary infiltrates and bilateral pleural effusions noted. These findings consistent with congestive heart failure. Bilateral cannot be excluded. No pneumothorax. No acute osseous abnormality P  IMPRESSION: 1. Line and tubes in stable position. 2. Persistent cardiomegaly with bilateral pulmonary alveolar infiltrates and pleural effusions. No interim improvement. Congestive heart failure could present in this fashion. Bilateral pneumonia cannot be excluded.   Electronically Signed   By: Maisie Fushomas  Register   On: 08/12/2014 07:20   Dg Chest Port 1 View  08/11/2014   CLINICAL DATA:  Acute respiratory failure  EXAM: PORTABLE CHEST - 1 VIEW  COMPARISON:  08/10/2014  FINDINGS: Tubular device is stable. Bilateral airspace disease stable. Bilateral pleural effusions stable. Normal heart size. No pneumothorax. Hyperaeration.  IMPRESSION: Stable bilateral airspace disease.   Electronically Signed   By: Maryclare BeanArt  Hoss M.D.   On: 08/11/2014 10:16   Dg Chest Port 1 View  08/10/2014   CLINICAL DATA:  Acute respiratory acidosis; hypoxia  EXAM: PORTABLE CHEST - 1 VIEW  COMPARISON:  August 09, 2014.  FINDINGS: Endotracheal tube tip is 6.7 cm above carina. Nasogastric tube tip and side port are below the diaphragm. Central catheter tip is in the superior vena cava. No pneumothorax. There is underlying emphysema. There is cardiomegaly with generalized interstitial edema and small bilateral effusions. There is airspace consolidation in the left lower lobe. No adenopathy. No bone lesions. There is mild mid thoracic dextroscoliosis.  IMPRESSION: Tube and catheter positions as described without pneumothorax. Evidence of congestive heart failure superimposed on emphysematous change. Question superimposed pneumonia left lower lobe with consolidation in this area.   Electronically Signed   By:  Bretta BangWilliam  Woodruff M.D.   On: 08/10/2014 14:24        ASSESSMENT / PLAN:  PULMONARY OETT 10/25>>>  A: Acute hypoxic and hypercarbic respiratory failure - Required intubation more as a result of a panic attack superimposed on prob ETOH w/d  PNA  AE-COPD - likely severe COPD  -08/12/14 still on FiO2 0.60, vent rate increased o/n. Does not meet sbt criteria due to hypoxemia and agitation. Likely needs trach  P:   Full vent support; staff MD to weigh in on trach Wean Fio2 as  able Duoneb Q6 with Q3 PRN albuterol  Steroids 40 mg Q12 Lasix 40 QD held 11/2 Will need O2 assessment and decision regarding home regimen upon discharge (on no meds PTA)  CARDIOVASCULAR CVL 10/25 >> A-line 10/25 >> 10/29 A:  Sepsis - resolved off pressors Echo 08/03/14 EF 55-60%  -08/12/14 hypotensive overnight, d/c'd lasix and given NS bolus P:  Tele monitoring Diuresis as able PRN EKG with seroquel  RENAL A:  Hypokalemia/ hypomag - resolved Hypochloremia Contraction alkalosis   -08/12/14 alkalosis mildly improved, not resolved P:   Follow BMP Replete electrolytes as indicated Lasix held 11/2  GASTROINTESTINAL OGT >> A:   Aspiration risk  Protein calorie malnutrition  P:   PPI  Cont TFs @ 40cc/hr, at goal Colace QD, hold for diarrhea   HEMATOLOGIC A:   Mild thrombocytopenia - etoh vs. sepsis - improving Ecchymosis  -08/12/14 plts continue to improve P:  Trend CBC  Monitor signs of bleeding [place] heparin   INFECTIOUS A:   CAP +MRSA by PCR screen BCx2 10/23>>>neg resp viral panel 10/24>>>Negative Abx: azithro 10/23>>>10/28 Abx: rocephin 10/23>>> 10/30  -08/12/14 off abx, CXR bilat infiltrates and pleural effusions no interim improvement P:   Contact isolation per protocol  ENDOCRINE A:   Hyperglycemia - stress and steroids  P:   SSI at goal < 180 Solumedrol 40mg  q12  NEUROLOGIC A:   Acute encephalopathy - failed precedex gtt Panic attack DTs  Hx seizure  disorder, on dilantin at therapeutic goal 11/1 after correction   - 08/12/14: still very agitated on WUA  P:   RASS goal: 0 to -1 Propofol gtt, maintain with wua  Cont fentanyl gtt, ativan 2mg  scheduled TID  Increased seroquel 150 bid on 11/1 Cont thiamine, folate, MVI Monitor for DT's Cont phenytoin 100mg  IV TID  FAMILY  Updates: wife & daughter-in-law at bedside 10/29, 10/31 by RB, 08/12/14  Inter-disciplinary family meet or Palliative Care meeting:  Discussed with family regarding goals of care.  They indicate he would want tracheostomy if needed.  Currently day 9 of ETT. Concerned he will have difficulty with weaning from mechanical vent and long term prognosis with severe COPD & ETOH abuse hx.  Tawni CarnesAndrew Lizett Chowning, MD 08/12/2014, 9:32 AM PGY-2, [facility]

## 2014-08-12 NOTE — Progress Notes (Signed)
CRITICAL VALUE ALERT  Critical value received:  CO2 42  Date of notification:  08/12/2014  Time of notification:  05:00  Critical value read back:Yes.    Nurse who received alert:  Jamesetta Soarla Porter, RN  MD notified (1st page):  Tyson AliasFeinstein  Time of first page:  05:25  MD notified (2nd page):  Time of second page:  Responding MD:  Tyson AliasFeinstein  Time MD responded:  05:26

## 2014-08-12 NOTE — Progress Notes (Signed)
NUTRITION FOLLOW UP  Intervention:    Continue Vital High Protein, increase goal rate to 55 ml/h (1320 ml per day) without Prostat, to provide 1320 kcals, 116 gm protein, 1104 ml free water daily.  Propofol providing additional 346 kcal.  TF regimen with Propofol will provide 1666 kcal and 116 g protein (98% of calorie needs and 100% of protein needs).   Nutrition Dx:   Inadequate oral intake related to inability to eat as evidenced by NPO; ongoing  Goal:   Pt to meet >/= 90% of their estimated nutrition needs; met  Monitor:   TF tolerance/adequacy, weight trend, labs, vent status.  Assessment:   49 yom smoker with PMH of COPD, ETOH and benzo dep anxiety. Admitted 10/23 w/dx CAP and AECOPD.   Patient developed worsening resp failure and delirium/agitation. Transferred to MICU from 3S-Stepdown 10/24. Required emergent intubation 10/25.   Patient remains intubated on ventilator support MV: 13.1 L/min Temp (24hrs), Avg:98.7 F (37.1 C), Min:97.6 F (36.4 C), Max:99.5 F (37.5 C)  Propofol: 13.1 ml/hr providing 346 kcals/day.  Patient has OG tube in place with tip of tube in stomach. Vital HP is infusing @ 50 ml/hr with 30 ml Prostat via tube once daily. Tube feeding regimen currently providing 1300 kcal, 120 grams protein, and 1003 ml free water daily.   Residuals: 10 mL per RN  Last BM: 11/1   Height: Ht Readings from Last 1 Encounters:  08/03/14 _0  (1.676 m)    Weight Status:   Wt Readings from Last 1 Encounters:  08/12/14 125 lb 14.1 oz (57.1 kg)   08/05/14 123 lb 3.8 oz (55.9 kg)    Body mass index is 20.33 kg/(m^2).   Re-estimated needs:  Kcal: 1696 Protein: 90-110 gm Fluid: 1.7 L  Skin: intact  Diet Order: Diet NPO time specified   Intake/Output Summary (Last 24 hours) at 08/12/14 1205 Last data filed at 08/12/14 1000  Gross per 24 hour  Intake 3175.89 ml  Output   1630 ml  Net 1545.89 ml   Labs:   Recent Labs Lab 08/08/14 0435   08/08/14 1630 08/09/14 0430 08/11/14 0503 08/12/14 0414  NA  --   < >  --  142 138 139  K  --   < >  --  4.6 3.5* 3.9  CL  --   < >  --  95* 89* 92*  CO2  --   < >  --  39* >45* 42*  BUN  --   < >  --  31* 39* 39*  CREATININE  --   < >  --  0.45* 0.53 0.50  CALCIUM  --   < >  --  9.0 8.7 8.6  MG 1.8  --  1.4*  --  1.3*  --   PHOS 4.4  --  4.3  --  3.5  --   GLUCOSE  --   < >  --  110* 126* 141*  < > = values in this interval not displayed.  CBG (last 3)   Recent Labs  08/11/14 1954 08/12/14 0019 08/12/14 0410  GLUCAP 167* 154* 142*    Scheduled Meds: . antiseptic oral rinse  7 mL Mouth Rinse QID  . aspirin  325 mg Per Tube Daily  . chlorhexidine  15 mL Mouth Rinse BID  . docusate  100 mg Oral Daily  . feeding supplement (VITAL HIGH PROTEIN)  1,000 mL Per Tube Q24H  . fentaNYL  100 mcg Intravenous  Once  . folic acid  1 mg Per Tube Daily  . heparin  5,000 Units Subcutaneous 3 times per day  . insulin aspart  0-9 Units Subcutaneous 6 times per day  . ipratropium-albuterol  3 mL Nebulization Q6H  . LORazepam  2 mg Per Tube Q6H  . methylPREDNISolone (SOLU-MEDROL) injection  40 mg Intravenous Q12H  . multivitamin  5 mL Oral Daily  . nicotine  14 mg Transdermal Daily  . pantoprazole sodium  40 mg Per Tube Daily  . phenytoin (DILANTIN) IV  100 mg Intravenous 3 times per day  . QUEtiapine  150 mg Per Tube BID  . senna  1 tablet Per Tube Daily  . sodium chloride  3 mL Intravenous Q12H  . thiamine  100 mg Per Tube Daily    Continuous Infusions: . sodium chloride 10 mL/hr at 08/12/14 0826  . fentaNYL infusion INTRAVENOUS 150 mcg/hr (08/12/14 1055)  . propofol 30 mcg/kg/min (08/12/14 1055)    Molli Barrows, RD, LDN, [place] Pager [phone] After Hours Pager [phone]

## 2014-08-13 ENCOUNTER — Inpatient Hospital Stay (HOSPITAL_COMMUNITY): Payer: Medicare Other

## 2014-08-13 DIAGNOSIS — J189 Pneumonia, unspecified organism: Secondary | ICD-10-CM | POA: Insufficient documentation

## 2014-08-13 DIAGNOSIS — J9601 Acute respiratory failure with hypoxia: Secondary | ICD-10-CM | POA: Insufficient documentation

## 2014-08-13 DIAGNOSIS — J441 Chronic obstructive pulmonary disease with (acute) exacerbation: Secondary | ICD-10-CM | POA: Insufficient documentation

## 2014-08-13 DIAGNOSIS — J440 Chronic obstructive pulmonary disease with acute lower respiratory infection: Secondary | ICD-10-CM

## 2014-08-13 LAB — BLOOD GAS, ARTERIAL
Acid-Base Excess: 15.9 mmol/L — ABNORMAL HIGH (ref 0.0–2.0)
Bicarbonate: 41.4 mEq/L — ABNORMAL HIGH (ref 20.0–24.0)
Drawn by: 31288
FIO2: 0.6 %
LHR: 20 {breaths}/min
MECHVT: 500 mL
O2 Saturation: 88.1 %
PEEP: 10 cmH2O
Patient temperature: 98.6
TCO2: 43.4 mmol/L (ref 0–100)
pCO2 arterial: 65.2 mmHg (ref 35.0–45.0)
pH, Arterial: 7.419 (ref 7.350–7.450)
pO2, Arterial: 54.5 mmHg — ABNORMAL LOW (ref 80.0–100.0)

## 2014-08-13 LAB — GLUCOSE, CAPILLARY
GLUCOSE-CAPILLARY: 158 mg/dL — AB (ref 70–99)
Glucose-Capillary: 113 mg/dL — ABNORMAL HIGH (ref 70–99)
Glucose-Capillary: 119 mg/dL — ABNORMAL HIGH (ref 70–99)
Glucose-Capillary: 147 mg/dL — ABNORMAL HIGH (ref 70–99)
Glucose-Capillary: 164 mg/dL — ABNORMAL HIGH (ref 70–99)
Glucose-Capillary: 164 mg/dL — ABNORMAL HIGH (ref 70–99)
Glucose-Capillary: 182 mg/dL — ABNORMAL HIGH (ref 70–99)

## 2014-08-13 LAB — BASIC METABOLIC PANEL
ANION GAP: 10 (ref 5–15)
BUN: 42 mg/dL — ABNORMAL HIGH (ref 6–23)
CO2: 39 mEq/L — ABNORMAL HIGH (ref 19–32)
CREATININE: 0.49 mg/dL — AB (ref 0.50–1.35)
Calcium: 8.9 mg/dL (ref 8.4–10.5)
Chloride: 93 mEq/L — ABNORMAL LOW (ref 96–112)
GFR calc non Af Amer: >90 mL/min (ref >90)
Glucose, Bld: 114 mg/dL — ABNORMAL HIGH (ref 70–99)
POTASSIUM: 3.7 meq/L (ref 3.7–5.3)
Sodium: 142 mEq/L (ref 137–147)

## 2014-08-13 LAB — LACTIC ACID, PLASMA: Lactic Acid, Venous: 0.8 mmol/L (ref 0.5–2.2)

## 2014-08-13 LAB — PRO B NATRIURETIC PEPTIDE: Pro B Natriuretic peptide (BNP): 9178 pg/mL — ABNORMAL HIGH (ref 0–125)

## 2014-08-13 LAB — MAGNESIUM: MAGNESIUM: 2 mg/dL (ref 1.5–2.5)

## 2014-08-13 LAB — PROTIME-INR
INR: 1.02 (ref 0.00–1.49)
Prothrombin Time: 13.5 seconds (ref 11.6–15.2)

## 2014-08-13 LAB — CK TOTAL AND CKMB (NOT AT ARMC)
CK TOTAL: 65 U/L (ref 7–232)
CK, MB: 3.8 ng/mL (ref 0.3–4.0)
Relative Index: INVALID (ref 0.0–2.5)

## 2014-08-13 LAB — TRIGLYCERIDES: Triglycerides: 100 mg/dL (ref <150)

## 2014-08-13 LAB — PHOSPHORUS: PHOSPHORUS: 3.1 mg/dL (ref 2.3–4.6)

## 2014-08-13 MED ORDER — QUETIAPINE FUMARATE 300 MG PO TABS
300.0000 mg | ORAL_TABLET | Freq: Two times a day (BID) | ORAL | Status: DC
  Administered 2014-08-13: 300 mg
  Filled 2014-08-13 (×3): qty 1

## 2014-08-13 MED ORDER — LORAZEPAM 1 MG PO TABS
2.0000 mg | ORAL_TABLET | Freq: Three times a day (TID) | ORAL | Status: DC
  Administered 2014-08-13 – 2014-08-17 (×13): 2 mg
  Filled 2014-08-13 (×13): qty 2

## 2014-08-13 MED ORDER — DEXMEDETOMIDINE HCL IN NACL 400 MCG/100ML IV SOLN
0.4000 ug/kg/h | INTRAVENOUS | Status: DC
  Administered 2014-08-13: 1.121 ug/kg/h via INTRAVENOUS
  Administered 2014-08-14: 0.701 ug/kg/h via INTRAVENOUS
  Filled 2014-08-13 (×3): qty 100

## 2014-08-13 MED ORDER — LORAZEPAM 2 MG/ML PO CONC: 2.0000 mg | Freq: Three times a day (TID) | ORAL | Status: DC

## 2014-08-13 NOTE — Progress Notes (Signed)
PULMONARY / CRITICAL CARE MEDICINE   Name: Kenneth Bass MRN: [id number] DOB: 08/30/1951    ADMISSION DATE:  2014-08-30 CONSULTATION DATE: 08/03/2014  REFERRING MD:  Criselda Peaches   CHIEF COMPLAINT:  Acute on chronic respiratory failure   INITIAL PRESENTATION: 63 y/o M smoker with PMH of COPD, ETOH and benzo dep anxiety. Admitted 10/23 w/ working dx CAP and AECOPD. Developed worsening resp failure and delirium/agitation on 10/24, intubated on 10/25 for sudden onset bronchospasm.   SIGNIFICANT EVENTS/STUDIES: 10/23  admitted 10/24  progressive resp failure, placed on BIPAP. Transferred to ICU  10/25  severe anxiety/ agitation. Desaturated and did not improve w/ BIPAP. Required emergent intubation.  10/27  off pressors 10/31  Sedate, intermittent agitation, fiO2 to 60% 11/01  Intermittent agitation, high dose fentanyl / propofol  11/02  D/C propofol, continued agitation on precedex, versed added  SUBJECTIVE/OVERNIGHT EVENTS:  08/13/14: became agitated on precedex (noted to have failed precedex 2 times), added versed and increased seroquel again. RN reports having difficulty with agitation intermittently  VITAL SIGNS: Temp:  [98.5 F (36.9 C)-99.5 F (37.5 C)] 98.9 F (37.2 C) (11/03 0347) Pulse Rate:  [74-125] 78 (11/03 0715) Resp:  [15-30] 20 (11/03 0715) BP: (88-220)/(42-104) 110/48 mmHg (11/03 0715) SpO2:  [86 %-97 %] 91 % (11/03 0715) FiO2 (%):  [50 %-60 %] 60 % (11/03 0413) Weight:  [57.1 kg (125 lb 14.1 oz)] 57.1 kg (125 lb 14.1 oz) (11/03 0500)  HEMODYNAMICS:    VENTILATOR SETTINGS: Vent Mode:  [-] PRVC FiO2 (%):  [50 %-60 %] 60 % Set Rate:  [20 bmp] 20 bmp Vt Set:  [500 mL] 500 mL PEEP:  [10 cmH20] 10 cmH20 Plateau Pressure:  [18 cmH20-24 cmH20] 24 cmH20  INTAKE / OUTPUT:  Intake/Output Summary (Last 24 hours) at 08/13/14 0809 Last data filed at 08/13/14 0700  Gross per 24 hour  Intake 2724.08 ml  Output   2095 ml  Net 629.08 ml    PHYSICAL  EXAMINATION: General: Thin chronically ill-appearing male, appears older than stated age. No distress Neuro: Sedated on vent, RASS +3 HEENT:  Scattered abrasions on scalp, orally intubated, OGT Cardiovascular: RRR, no murmur or JVD Lungs: Coarse and diminished over bases; no wheezing, moving air  Abdomen:  Soft, + bowel sounds. Mild presacral edema. Musculoskeletal:  Intact. No LE edema. Skin: Resolving ecchymoses UE and LE  LABS: PULMONARY  Recent Labs Lab 08/13/14 0324  PHART 7.419  PCO2ART 65.2*  PO2ART 54.5*  HCO3 41.4*  TCO2 43.4  O2SAT 88.1    CBC  Recent Labs Lab 08/09/14 0430 08/11/14 0503 08/12/14 0414  HGB 14.8 11.6* 10.9*  HCT 46.7 35.2* 33.7*  WBC 9.8 10.4 10.7*  PLT 128* 133* 141*    COAGULATION  Recent Labs Lab 08/13/14 0336  INR 1.02    CARDIAC  No results for input(s): TROPONINI in the last 168 hours.  Recent Labs Lab 08/13/14 0336  PROBNP 9178.0*     CHEMISTRY  Recent Labs Lab 08/07/14 1700 08/08/14 0435 08/08/14 0725 08/08/14 1630 08/09/14 0430 08/11/14 0503 08/12/14 0414 08/13/14 0336  NA  --   --  141  --  142 138 139 142  K  --   --  5.2  --  4.6 3.5* 3.9 3.7  CL  --   --  98  --  95* 89* 92* 93*  CO2  --   --  36*  --  39* >45* 42* 39*  GLUCOSE  --   --  140*  --  110* 126* 141* 114*  BUN  --   --  28*  --  31* 39* 39* 42*  CREATININE  --   --  0.51  --  0.45* 0.53 0.50 0.49*  CALCIUM  --   --  8.8  --  9.0 8.7 8.6 8.9  MG 1.9 1.8  --  1.4*  --  1.3*  --  2.0  PHOS 3.8 4.4  --  4.3  --  3.5  --  3.1   Estimated Creatinine Clearance: 74.3 mL/min (by C-G formula based on Cr of 0.5).   LIVER  Recent Labs Lab 08/10/14 0423 08/13/14 0336  ALBUMIN 2.8*  --   INR  --  1.02    INFECTIOUS  Recent Labs Lab 08/13/14 0045  LATICACIDVEN 0.8    ENDOCRINE CBG (last 3)   Recent Labs  08/12/14 1911 08/13/14 0022 08/13/14 0338  GLUCAP 163* 164* 119*    IMAGING x48h Dg Chest Port 1 View  08/13/2014    CLINICAL DATA:  Acute respiratory failure.  EXAM: PORTABLE CHEST - 1 VIEW  COMPARISON:  08/12/2014  FINDINGS: Endotracheal tube tip between the clavicular heads and carina. Right-sided central line in stable position. Nasogastric tube at least reaches the stomach, beyond which there is nonvisualization.  Hazy basilar opacities consistent layering fluid. Interstitial opacities with Kerley lines and alveolar opacification also present. Coarse opacities correlates with known emphysema. No pneumothorax. Stable heart size. Fullness of the hila which is likely vascular.  IMPRESSION: 1. Stable positioning of tubes and central line. 2. Pulmonary edema and pleural effusions superimposed on emphysema. Pneumonia may also be present.   Electronically Signed   By: Tiburcio PeaJonathan  Watts M.D.   On: 08/13/2014 07:39   Dg Chest Port 1 View  08/12/2014   CLINICAL DATA:  Respiratory failure.  EXAM: PORTABLE CHEST - 1 VIEW  COMPARISON:  08/11/2014.  FINDINGS: Endotracheal tube, NG tube, right IJ line stable position. Cardiomegaly with diffuse pulmonary infiltrates and bilateral pleural effusions noted. These findings consistent with congestive heart failure. Bilateral cannot be excluded. No pneumothorax. No acute osseous abnormality P  IMPRESSION: 1. Line and tubes in stable position. 2. Persistent cardiomegaly with bilateral pulmonary alveolar infiltrates and pleural effusions. No interim improvement. Congestive heart failure could present in this fashion. Bilateral pneumonia cannot be excluded.   Electronically Signed   By: Maisie Fushomas  Register   On: 08/12/2014 07:20    ASSESSMENT / PLAN:  PULMONARY OETT 10/25>>>  A: Acute hypoxic and hypercarbic respiratory failure PNA  AE-COPD - likely severe COPD  -08/13/14 Does not meet sbt criteria due to hypoxemia and agitation. Likely needs trach. P:   Full vent support; staff MD to weigh in on trach Wean Fio2 as able Duoneb Q6 with Q3 PRN albuterol  Steroids 40 mg Q12 Continue lasix  IV Will need O2 assessment and decision regarding home regimen upon discharge (on no meds PTA)  CARDIOVASCULAR CVL 10/25 >> A-line 10/25 >> 10/29 A:  Sepsis - resolved off pressors Echo 08/03/14 EF 55-60%  -08/13/14 lasix x 1 on 11/2, still net positive. CXR with bilat pulm edema ?component of CHF, echo unable to eval diastolic dysfunction P:  Tele monitoring PRN EKG with seroquel  RENAL A:  Hypokalemia/ hypomag - resolved Hypochloremia Contraction alkalosis   -08/13/14 bicarb improving 39 P:   Follow BMP Replete electrolytes as indicated  GASTROINTESTINAL OGT >> A:   Aspiration risk  Protein calorie malnutrition  P:   PPI  Cont TFs @ 55cc/hr, at goal Colace QD, hold for diarrhea   HEMATOLOGIC A:   Mild thrombocytopenia - etoh vs. sepsis - improving Ecchymosis  -08/13/14 no evidence of bleeding P:  Trend CBC  Monitor signs of bleeding [place] heparin   INFECTIOUS +MRSA by PCR screen BCx2 10/23>>>neg resp viral panel 10/24>>>Negative Abx: azithro 10/23>>>10/28 Abx: rocephin 10/23>>> 10/30 A:   CAP  -08/13/14 off abx. P:   Contact isolation per protocol  ENDOCRINE A:   Stable CBGs - stress and steroids causing some hyperglycemia  P:   SSI at goal < 180 Solumedrol 40mg  q12  NEUROLOGIC A:   Acute encephalopathy - failed precedex gtt Panic attack DTs  Hx seizure disorder, on dilantin at therapeutic goal 11/1 after correction  -08/13/14: still very agitated on WUA P:   RASS goal: 0 to -1 Precedex gtt Versed gtt Fentanyl gtt Ativan 2mg  TID  Seroquel 200 BID Phenytoin 100mg  IV TID Cont thiamine, folate, MVI Monitor for DT's   FAMILY  Updates: wife & daughter-in-law at bedside 10/29, 10/31 by RB, 08/12/14 by MR  Inter-disciplinary family meet or Palliative Care meeting:  Discussed with family regarding goals of care.  They indicate he would want tracheostomy if needed but they do not think it is in his best interest.  Currently day 10 of ETT.  Concerned he will have difficulty with weaning from mechanical vent and long term prognosis with severe COPD & ETOH abuse hx.   Tawni CarnesAndrew Desirie Minteer, MD 08/13/2014, 8:09 AM PGY-2, [facility]

## 2014-08-13 NOTE — Progress Notes (Signed)
eLink Physician-Brief Progress Note Patient Name: Kenneth GullyFrancis J Bass DOB: [birth year] MRN: [id number]   Date of Service  08/13/2014  HPI/Events of Note  Severe agitn On max fent/precedex & versed IV  eICU Interventions  Drop PEEP to 5 for comfort Resume ativan via tube (this pt was on daily ativan at home) Increase seroquel to 300 bid     Intervention Category Major Interventions: Delirium, psychosis, severe agitation - evaluation and management  [name] V. 08/13/2014, 8:33 PM

## 2014-08-14 ENCOUNTER — Inpatient Hospital Stay (HOSPITAL_COMMUNITY): Payer: Medicare Other

## 2014-08-14 ENCOUNTER — Encounter (HOSPITAL_COMMUNITY): Payer: Self-pay | Admitting: *Deleted

## 2014-08-14 DIAGNOSIS — J9602 Acute respiratory failure with hypercapnia: Secondary | ICD-10-CM | POA: Insufficient documentation

## 2014-08-14 DIAGNOSIS — J9600 Acute respiratory failure, unspecified whether with hypoxia or hypercapnia: Secondary | ICD-10-CM | POA: Insufficient documentation

## 2014-08-14 DIAGNOSIS — F10931 Alcohol use, unspecified with withdrawal delirium: Secondary | ICD-10-CM | POA: Insufficient documentation

## 2014-08-14 DIAGNOSIS — F10231 Alcohol dependence with withdrawal delirium: Secondary | ICD-10-CM | POA: Insufficient documentation

## 2014-08-14 DIAGNOSIS — E872 Acidosis: Secondary | ICD-10-CM | POA: Insufficient documentation

## 2014-08-14 LAB — BASIC METABOLIC PANEL
ANION GAP: 9 (ref 5–15)
BUN: 45 mg/dL — ABNORMAL HIGH (ref 6–23)
CO2: 36 mEq/L — ABNORMAL HIGH (ref 19–32)
Calcium: 8.4 mg/dL (ref 8.4–10.5)
Chloride: 101 mEq/L (ref 96–112)
Creatinine, Ser: 0.49 mg/dL — ABNORMAL LOW (ref 0.50–1.35)
GFR calc Af Amer: >90 mL/min (ref >90)
GFR calc non Af Amer: >90 mL/min (ref >90)
Glucose, Bld: 115 mg/dL — ABNORMAL HIGH (ref 70–99)
Potassium: 3.9 mEq/L (ref 3.7–5.3)
Sodium: 146 mEq/L (ref 137–147)

## 2014-08-14 LAB — MAGNESIUM: Magnesium: 1.8 mg/dL (ref 1.5–2.5)

## 2014-08-14 LAB — GLUCOSE, CAPILLARY
GLUCOSE-CAPILLARY: 134 mg/dL — AB (ref 70–99)
GLUCOSE-CAPILLARY: 107 mg/dL — AB (ref 70–99)
Glucose-Capillary: 83 mg/dL (ref 70–99)
Glucose-Capillary: 198 mg/dL — ABNORMAL HIGH (ref 70–99)
Glucose-Capillary: 123 mg/dL — ABNORMAL HIGH (ref 70–99)

## 2014-08-14 LAB — CBC WITH DIFFERENTIAL/PLATELET
Basophils Absolute: 0 10*3/uL (ref 0.0–0.1)
Basophils Relative: 0 % (ref 0–1)
EOS ABS: 0.2 10*3/uL (ref 0.0–0.7)
EOS PCT: 2 % (ref 0–5)
HCT: 32.1 % — ABNORMAL LOW (ref 39.0–52.0)
HEMOGLOBIN: 10 g/dL — AB (ref 13.0–17.0)
Lymphocytes Relative: 6 % — ABNORMAL LOW (ref 12–46)
Lymphs Abs: 0.6 10*3/uL — ABNORMAL LOW (ref 0.7–4.0)
MCH: 32.6 pg (ref 26.0–34.0)
MCHC: 31.2 g/dL (ref 30.0–36.0)
MCV: 104.6 fL — AB (ref 78.0–100.0)
MONOS PCT: 5 % (ref 3–12)
Monocytes Absolute: 0.5 10*3/uL (ref 0.1–1.0)
NEUTROS PCT: 87 % — AB (ref 43–77)
Neutro Abs: 8.9 10*3/uL — ABNORMAL HIGH (ref 1.7–7.7)
Platelets: 206 10*3/uL (ref 150–400)
RBC: 3.07 MIL/uL — ABNORMAL LOW (ref 4.22–5.81)
RDW: 15.7 % — ABNORMAL HIGH (ref 11.5–15.5)
WBC: 10.2 10*3/uL (ref 4.0–10.5)

## 2014-08-14 LAB — TRIGLYCERIDES: Triglycerides: 111 mg/dL (ref <150)

## 2014-08-14 LAB — PROTIME-INR
INR: 1.11 (ref 0.00–1.49)
Prothrombin Time: 14.4 seconds (ref 11.6–15.2)

## 2014-08-14 LAB — PHOSPHORUS: Phosphorus: 3.2 mg/dL (ref 2.3–4.6)

## 2014-08-14 MED ORDER — VECURONIUM BROMIDE 10 MG IV SOLR
10.0000 mg | Freq: Once | INTRAVENOUS | Status: AC
  Administered 2014-08-14: 10 mg via INTRAVENOUS

## 2014-08-14 MED ORDER — PIPERACILLIN-TAZOBACTAM 3.375 G IVPB
3.3750 g | Freq: Three times a day (TID) | INTRAVENOUS | Status: DC
  Administered 2014-08-14 – 2014-08-20 (×17): 3.375 g via INTRAVENOUS
  Filled 2014-08-14 (×21): qty 50

## 2014-08-14 MED ORDER — VECURONIUM BROMIDE 10 MG IV SOLR
INTRAVENOUS | Status: AC
  Filled 2014-08-14: qty 10

## 2014-08-14 MED ORDER — SODIUM CHLORIDE 0.9 % IV SOLN
0.0000 ug/h | INTRAVENOUS | Status: DC
  Administered 2014-08-14 (×2): 400 ug/h via INTRAVENOUS
  Administered 2014-08-15: 200 ug/h via INTRAVENOUS
  Administered 2014-08-15 (×2): 400 ug/h via INTRAVENOUS
  Administered 2014-08-16: 300 ug/h via INTRAVENOUS
  Administered 2014-08-16 – 2014-08-17 (×3): 200 ug/h via INTRAVENOUS
  Administered 2014-08-18 – 2014-08-20 (×4): 250 ug/h via INTRAVENOUS
  Administered 2014-08-20: 200 ug/h via INTRAVENOUS
  Administered 2014-08-21: 350 ug/h via INTRAVENOUS
  Administered 2014-08-21: 400 ug/h via INTRAVENOUS
  Filled 2014-08-14 (×18): qty 50

## 2014-08-14 MED ORDER — MAGNESIUM SULFATE 2 GM/50ML IV SOLN
2.0000 g | Freq: Once | INTRAVENOUS | Status: AC
  Administered 2014-08-14: 2 g via INTRAVENOUS
  Filled 2014-08-14: qty 50

## 2014-08-14 MED ORDER — LORAZEPAM 1 MG PO TABS
1.0000 mg | ORAL_TABLET | Freq: Once | ORAL | Status: DC
  Filled 2014-08-14: qty 1

## 2014-08-14 MED ORDER — SODIUM CHLORIDE 0.9 % IV SOLN
1.0000 mg/h | INTRAVENOUS | Status: DC
  Filled 2014-08-14: qty 5

## 2014-08-14 MED ORDER — QUETIAPINE FUMARATE 200 MG PO TABS
200.0000 mg | ORAL_TABLET | Freq: Three times a day (TID) | ORAL | Status: DC
  Administered 2014-08-14 – 2014-08-21 (×22): 200 mg
  Filled 2014-08-14 (×25): qty 1

## 2014-08-14 MED ORDER — VANCOMYCIN HCL IN DEXTROSE 750-5 MG/150ML-% IV SOLN
750.0000 mg | Freq: Two times a day (BID) | INTRAVENOUS | Status: DC
  Administered 2014-08-14 – 2014-08-15 (×2): 750 mg via INTRAVENOUS
  Filled 2014-08-14 (×3): qty 150

## 2014-08-14 MED ORDER — FUROSEMIDE 10 MG/ML IJ SOLN
40.0000 mg | Freq: Three times a day (TID) | INTRAMUSCULAR | Status: DC
  Administered 2014-08-14 – 2014-08-15 (×3): 40 mg via INTRAVENOUS
  Filled 2014-08-14 (×6): qty 4

## 2014-08-14 MED ORDER — FENTANYL BOLUS VIA INFUSION
25.0000 ug | INTRAVENOUS | Status: DC | PRN
  Administered 2014-08-19 – 2014-08-20 (×4): 50 ug via INTRAVENOUS
  Filled 2014-08-14: qty 50

## 2014-08-14 MED ORDER — ETOMIDATE 2 MG/ML IV SOLN
40.0000 mg | Freq: Once | INTRAVENOUS | Status: DC
  Filled 2014-08-14: qty 20

## 2014-08-14 MED ORDER — POTASSIUM CHLORIDE 20 MEQ/15ML (10%) PO SOLN
40.0000 meq | Freq: Two times a day (BID) | ORAL | Status: DC
Start: 2014-08-14 — End: 2014-08-21
  Administered 2014-08-14 – 2014-08-21 (×15): 40 meq
  Filled 2014-08-14 (×20): qty 30

## 2014-08-14 MED ORDER — PROPOFOL 10 MG/ML IV EMUL
INTRAVENOUS | Status: AC
  Filled 2014-08-14: qty 100

## 2014-08-14 MED ORDER — HYDROMORPHONE BOLUS VIA INFUSION
0.5000 mg | INTRAVENOUS | Status: DC | PRN
  Filled 2014-08-14: qty 2

## 2014-08-14 MED ORDER — PROPOFOL 10 MG/ML IV EMUL
0.0000 ug/kg/min | INTRAVENOUS | Status: DC
  Administered 2014-08-14: 50 ug/kg/min via INTRAVENOUS
  Administered 2014-08-14: 25 ug/kg/min via INTRAVENOUS
  Administered 2014-08-15 (×2): 50 ug/kg/min via INTRAVENOUS
  Administered 2014-08-15: 30 ug/kg/min via INTRAVENOUS
  Administered 2014-08-15 – 2014-08-16 (×2): 50 ug/kg/min via INTRAVENOUS
  Administered 2014-08-16: 30 ug/kg/min via INTRAVENOUS
  Administered 2014-08-16 – 2014-08-17 (×7): 50 ug/kg/min via INTRAVENOUS
  Administered 2014-08-18: 35 ug/kg/min via INTRAVENOUS
  Administered 2014-08-18: 50 ug/kg/min via INTRAVENOUS
  Administered 2014-08-18: 40 ug/kg/min via INTRAVENOUS
  Administered 2014-08-18: 50 ug/kg/min via INTRAVENOUS
  Administered 2014-08-19: 40 ug/kg/min via INTRAVENOUS
  Administered 2014-08-19: 10 ug/kg/min via INTRAVENOUS
  Administered 2014-08-20 (×2): 45 ug/kg/min via INTRAVENOUS
  Filled 2014-08-14 (×23): qty 100

## 2014-08-14 NOTE — Progress Notes (Signed)
ANTIBIOTIC CONSULT NOTE - INITIAL  Pharmacy Consult for Vancomycin and Zosyn Indication: HCAP  No Known Allergies  Patient Measurements: Height: _0  (167.6 cm) Weight: 125 lb 3.5 oz (56.8 kg) IBW/kg (Calculated) : 63.8  Vital Signs: Temp: 99.7 F (37.6 C) (11/04 1205) Temp Source: Oral (11/04 1400) BP: 92/47 mmHg (11/04 1400) Pulse Rate: 72 (11/04 1428) Intake/Output from previous day: 11/03 0701 - 11/04 0700 In: 2845.6 [I.V.:1715.6; NG/GT:1130] Out: 2015 [Urine:2015] Intake/Output from this shift: Total I/O In: 469.9 [I.V.:469.9] Out: 350 [Urine:350]  Labs:  Recent Labs  08/12/14 0414 08/13/14 0336 08/14/14 1011 08/14/14 1205  WBC 10.7*  --  10.2  --   HGB 10.9*  --  10.0*  --   PLT 141*  --  206  --   CREATININE 0.50 0.49*  --  0.49*   Estimated Creatinine Clearance: 74 mL/min (by C-G formula based on Cr of 0.49). No results for input(s): VANCOTROUGH, VANCOPEAK, VANCORANDOM, GENTTROUGH, GENTPEAK, GENTRANDOM, TOBRATROUGH, TOBRAPEAK, TOBRARND, AMIKACINPEAK, AMIKACINTROU, AMIKACIN in the last 72 hours.   Microbiology: Recent Results (from the past 720 hour(s))  Culture, blood (routine x 2) Call MD if unable to obtain prior to antibiotics being given     Status: None   Collection Time: 08/07/2014  8:38 PM  Result Value Ref Range Status   Specimen Description BLOOD HAND RIGHT  Final   Special Requests BOTTLES DRAWN AEROBIC AND ANAEROBIC 5CC  Final   Culture  Setup Time   Final    08/03/2014 01:11 Performed at [facility]   Culture   Final    NO GROWTH 5 DAYS Performed at [facility]   Report Status 08/09/2014 FINAL  Final  Culture, blood (routine x 2) Call MD if unable to obtain prior to antibiotics being given     Status: None   Collection Time: 08/01/2014  8:47 PM  Result Value Ref Range Status   Specimen Description BLOOD HAND LEFT  Final   Special Requests BOTTLES DRAWN AEROBIC AND ANAEROBIC 4CC  Final   Culture  Setup Time   Final   08/03/2014 01:11 Performed at [place]   Final    NO GROWTH 5 DAYS Performed at [facility]   Report Status 08/09/2014 FINAL  Final  MRSA PCR Screening     Status: Abnormal   Collection Time: 07/20/2014 10:57 PM  Result Value Ref Range Status   MRSA by PCR POSITIVE (A) NEGATIVE Final    Comment:        The GeneXpert MRSA Assay (FDA approved for NASAL specimens only), is one component of a comprehensive MRSA colonization surveillance program. It is not intended to diagnose MRSA infection nor to guide or monitor treatment for MRSA infections. RESULT CALLED TO, READ BACK BY AND VERIFIED WITH: HANCOCK,M RN [phone] AGT [id number] SKEEN,P  Respiratory virus panel     Status: None   Collection Time: 08/03/14  2:06 PM  Result Value Ref Range Status   Source - [facility]VPAN NASAL WASHINGS  Corrected    Comment: CORRECTED ON 10/26 AT 8250: PREVIOUSLY REPORTED AS NASAL WASHINGS   Respiratory Syncytial Virus A NOT DETECTED  Final   Respiratory Syncytial Virus B NOT DETECTED  Final   Influenza A NOT DETECTED  Final   Influenza B NOT DETECTED  Final   Parainfluenza 1 NOT DETECTED  Final   Parainfluenza 2 NOT DETECTED  Final   Parainfluenza 3 NOT DETECTED  Final   Metapneumovirus NOT DETECTED  Final   Rhinovirus NOT DETECTED  Final   Adenovirus NOT DETECTED  Final   Influenza A H1 NOT DETECTED  Final   Influenza A H3 NOT DETECTED  Final    Comment: (NOTE)       Normal Reference Range for each Analyte: NOT DETECTED Testing performed using the Luminex xTAG Respiratory Viral Panel test kit. The analytical performance characteristics of this assay have been determined by [facility].  The modifications have not been cleared or approved by the FDA. This assay has been validated pursuant to the CLIA regulations and is used for clinical purposes. Performed at [facility], respiratory (NON-Expectorated)     Status: None   Collection Time: 08/05/14   7:52 PM  Result Value Ref Range Status   Specimen Description TRACHEAL ASPIRATE  Final   Special Requests NONE  Final   Gram Stain   Final    FEW WBC PRESENT,BOTH PMN AND MONONUCLEAR NO SQUAMOUS EPITHELIAL CELLS SEEN NO ORGANISMS SEEN Performed at [facility]   Culture   Final    NO GROWTH 2 DAYS Performed at [facility]   Report Status 08/08/2014 FINAL  Final    Medical History: Past Medical History  Diagnosis Date  . Neuromyotonia neuropathy  . COPD (chronic obstructive pulmonary disease)   . Hypertension   . Seizures 2 in past.  2012  . Peripheral vascular disease   . GERD (gastroesophageal reflux disease)   . Smoker   . ETOHism   . Anxiety   . Benzodiazepine dependence   . PNA (pneumonia)   . Falls frequently     Medications:  Scheduled:  . antiseptic oral rinse  7 mL Mouth Rinse QID  . aspirin  325 mg Per Tube Daily  . chlorhexidine  15 mL Mouth Rinse BID  . docusate  100 mg Oral Daily  . etomidate  40 mg Intravenous Once  . feeding supplement (VITAL HIGH PROTEIN)  1,000 mL Per Tube Q24H  . folic acid  1 mg Per Tube Daily  . furosemide  40 mg Intravenous 3 times per day  . heparin  5,000 Units Subcutaneous 3 times per day  . insulin aspart  0-9 Units Subcutaneous 6 times per day  . ipratropium-albuterol  3 mL Nebulization Q6H  . LORazepam  1 mg Oral Once  . LORazepam  2 mg Per Tube TID  . methylPREDNISolone (SOLU-MEDROL) injection  40 mg Intravenous Q12H  . multivitamin  5 mL Oral Daily  . nicotine  14 mg Transdermal Daily  . pantoprazole sodium  40 mg Per Tube Daily  . phenytoin (DILANTIN) IV  100 mg Intravenous 3 times per day  . potassium chloride  40 mEq Per Tube BID  . QUEtiapine  200 mg Per Tube 3 times per day  . senna  1 tablet Per Tube Daily  . sodium chloride  10 mL Intravenous Q12H  . thiamine  100 mg Per Tube Daily   Assessment: 51 yoM to start Vancomycin IV and Zosyn IV for empiric treatment of HCAP.  Patient has had  increased agitation over night and worsening respiratory function. WBC, lactic acid, and renal function are stable. Patient is febrile at 99.7.   Goal of Therapy:  Vancomycin trough level 15-20 mcg/ml  Plan:  1) Vancomycin 750 mg IV q12h 2) Zosyn 3.375g IV q8h 3) Monitor renal function and check VT at steady state  Theron Arista, PharmD Clinical Pharmacist - Resident Pager: [phone] 11/4/20153:24  PM

## 2014-08-14 NOTE — Progress Notes (Signed)
CRITICAL CARE MEDICINE  INTERIM PROGRESS NOTE  Worsened respiratory status over past 24hrs (now tracheostomy this AM 11/4), CXR more concerning for multilobar pneumonia and not much response to diuresis, will restart antibiotic coverage. Discussed with Dr. Marchelle Gearingamaswamy.  Plan: Zosyn per pharm [facility]  Corianne Buccellato, MD 08/14/2014, 2:18 PM PGY-2, [facility]

## 2014-08-14 NOTE — Procedures (Signed)
Bronchoscopy  for Percutaneous  Tracheostomy  Name: Kenneth Bass MRN: [id number] DOB: 1951/02/10 Procedure: Bronchoscopy for Percutaneous Tracheostomy Indications: Diagnostic evaluation of the airways In conjunction with: Dr. Tyson AliasFeinstein   Procedure Details Consent: Risks of procedure as well as the alternatives and risks of each were explained to the (patient/caregiver).  Consent for procedure obtained. Time Out: Verified patient identification, verified procedure, site/side was marked, verified correct patient position, special equipment/implants available, medications/allergies/relevent history reviewed, required imaging and test results available.  Performed  In preparation for procedure, patient was given 100% FiO2 and bronchoscope lubricated. Sedation: Benzodiazepines and Muscle relaxants  Airway entered and the following bronchi were examined: RML.   Procedures performed: Endotracheal Tube retracted in 2 cm increments. Cannulation of airway observed. Dilation observed. Placement of trachel tube  observed . No overt complications. Bronchoscope removed.  , Patient placed back on 100% FiO2 at conclusion of procedure.    Evaluation Hemodynamic Status: Transient hypotension treated with fluid; O2 sats: stable throughout Patient's Current Condition: stable Specimens:  None Complications: No apparent complications Patient did tolerate procedure well.   Kenneth Bass ACNP Kenneth Bass PCCM Pager [phone] till 3 pm If no answer page [phone] 08/14/2014, 11:44 AM   Kenneth HellingVineet Yadier Bramhall, MD [facility] 08/14/2014, 12:07 PM Pager:  [phone] After 3pm call: [phone]

## 2014-08-14 NOTE — Procedures (Signed)
Perc trach See full dictation blood loss 5 cc Tolerated well 6 Shiley placed  Sealed Air CorporationDaniel J. Tyson AliasFeinstein, MD, FACP Pgr: [phone] [place] [facility]

## 2014-08-14 NOTE — Progress Notes (Signed)
PULMONARY / CRITICAL CARE MEDICINE   Name: Kenneth Bass MRN: [id number] DOB: 09-08-1951    ADMISSION DATE:  2014-08-05 CONSULTATION DATE: 08/03/2014  REFERRING MD:  Criselda Peaches   CHIEF COMPLAINT:  Acute on chronic respiratory failure   INITIAL PRESENTATION: 63 y/o M smoker with PMH of COPD, ETOH and benzo dep anxiety. Admitted 10/23 w/ working dx CAP and AECOPD. Developed worsening resp failure and delirium/agitation on 10/24, intubated on 10/25 for sudden onset bronchospasm.   SIGNIFICANT EVENTS/STUDIES: 10/23  admitted 10/24  progressive resp failure, placed on BIPAP. Transferred to ICU  10/25  severe anxiety/ agitation. Desaturated and did not improve w/ BIPAP. Required emergent intubation.  10/27  off pressors 10/31  Sedate, intermittent agitation, fiO2 to 60% 11/01  Intermittent agitation, high dose fentanyl / propofol  11/02  D/C propofol, continued agitation on precedex, versed added 11/03  Cont agitation on max precedex/versed/fentanyl gtt  SUBJECTIVE/OVERNIGHT EVENTS:  08/14/14: again agitated overnight, scheduled ativan added back on after being discontinued. More hypoxic (spo2 84-90%). Agitation better after receiving both ativan and seroquel.  VITAL SIGNS: Temp:  [98.7 F (37.1 C)-99.3 F (37.4 C)] 98.7 F (37.1 C) (11/04 0425) Pulse Rate:  [43-133] 117 (11/04 0700) Resp:  [10-35] 20 (11/04 0700) BP: (90-224)/(40-105) 199/71 mmHg (11/04 0700) SpO2:  [84 %-96 %] 87 % (11/04 0700) FiO2 (%):  [60 %-70 %] 60 % (11/04 0400) Weight:  [56.8 kg (125 lb 3.5 oz)] 56.8 kg (125 lb 3.5 oz) (11/04 0400)  HEMODYNAMICS:    VENTILATOR SETTINGS: Vent Mode:  [-] PRVC FiO2 (%):  [60 %-70 %] 60 % Set Rate:  [20 bmp] 20 bmp Vt Set:  [500 mL] 500 mL PEEP:  [5 cmH20-10 cmH20] 5 cmH20 Plateau Pressure:  [14 cmH20-24 cmH20] 14 cmH20  INTAKE / OUTPUT:  Intake/Output Summary (Last 24 hours) at 08/14/14 0732 Last data filed at 08/14/14 0600  Gross per 24 hour  Intake 2769.11 ml   Output   2015 ml  Net 754.11 ml    PHYSICAL EXAMINATION: General: Thin chronically ill-appearing male, appears older than stated age.  Neuro: Sedated on vent, RASS +2. HEENT: Scattered abrasions on scalp, orally intubated, OGT Cardiovascular: RRR, no murmur or JVD Lungs: Coarse and diminished over bases; no wheezing. Transmitted upper airway secretions. Abdomen:  Soft, thin, nontender, + bowel sounds. Musculoskeletal:  Intact. No LE edema. Skin: Ecchymoses UE and LE  LABS: PULMONARY  Recent Labs Lab 08/13/14 0324  PHART 7.419  PCO2ART 65.2*  PO2ART 54.5*  HCO3 41.4*  TCO2 43.4  O2SAT 88.1    CBC  Recent Labs Lab 08/09/14 0430 08/11/14 0503 08/12/14 0414  HGB 14.8 11.6* 10.9*  HCT 46.7 35.2* 33.7*  WBC 9.8 10.4 10.7*  PLT 128* 133* 141*    COAGULATION  Recent Labs Lab 08/13/14 0336 08/14/14 0350  INR 1.02 1.11    CARDIAC  No results for input(s): TROPONINI in the last 168 hours.  Recent Labs Lab 08/13/14 0336  PROBNP 9178.0*     CHEMISTRY  Recent Labs Lab 08/08/14 0435 08/08/14 0725 08/08/14 1630 08/09/14 0430 08/11/14 0503 08/12/14 0414 08/13/14 0336 08/14/14 0350  NA  --  141  --  142 138 139 142  --   K  --  5.2  --  4.6 3.5* 3.9 3.7  --   CL  --  98  --  95* 89* 92* 93*  --   CO2  --  36*  --  39* >45* 42* 39*  --  GLUCOSE  --  140*  --  110* 126* 141* 114*  --   BUN  --  28*  --  31* 39* 39* 42*  --   CREATININE  --  0.51  --  0.45* 0.53 0.50 0.49*  --   CALCIUM  --  8.8  --  9.0 8.7 8.6 8.9  --   MG 1.8  --  1.4*  --  1.3*  --  2.0 1.8  PHOS 4.4  --  4.3  --  3.5  --  3.1 3.2   Estimated Creatinine Clearance: 74 mL/min (by C-G formula based on Cr of 0.49).   LIVER  Recent Labs Lab 08/10/14 0423 08/13/14 0336 08/14/14 0350  ALBUMIN 2.8*  --   --   INR  --  1.02 1.11    INFECTIOUS  Recent Labs Lab 08/13/14 0045  LATICACIDVEN 0.8    ENDOCRINE CBG (last 3)   Recent Labs  08/13/14 1939 08/13/14 2344  08/14/14 0338  GLUCAP 164* 158* 83    IMAGING x48h Dg Chest Port 1 View  08/13/2014   CLINICAL DATA:  Acute respiratory failure.  EXAM: PORTABLE CHEST - 1 VIEW  COMPARISON:  08/12/2014  FINDINGS: Endotracheal tube tip between the clavicular heads and carina. Right-sided central line in stable position. Nasogastric tube at least reaches the stomach, beyond which there is nonvisualization.  Hazy basilar opacities consistent layering fluid. Interstitial opacities with Kerley lines and alveolar opacification also present. Coarse opacities correlates with known emphysema. No pneumothorax. Stable heart size. Fullness of the hila which is likely vascular.  IMPRESSION: 1. Stable positioning of tubes and central line. 2. Pulmonary edema and pleural effusions superimposed on emphysema. Pneumonia may also be present.   Electronically Signed   By: Tiburcio PeaJonathan  Watts M.D.   On: 08/13/2014 07:39    ASSESSMENT / PLAN:  PULMONARY OETT 10/25>>>  A: Acute hypoxic and hypercarbic respiratory failure PNA  AE-COPD - likely severe COPD  -08/14/14 Does not meet sbt criteria due to hypoxemia and agitation. Trach pending discussion with Dr. Tyson AliasFeinstein P:   Full vent support; staff MD to weigh in on trach Wean Fio2 as able Duoneb Q6 with Q3 PRN albuterol  Steroids 40 mg Q12 Will need O2 assessment and decision regarding home regimen upon discharge (on no meds PTA)  CARDIOVASCULAR CVL 10/25 >> A-line 10/25 >> 10/29 A:  Sepsis - resolved off pressors Echo 08/03/14 EF 55-60%  -08/14/14 more persistently hypertensive with ankle cuff 2/2 agitation P:  Tele monitoring PRN EKG with seroquel  RENAL A:  Hypokalemia/ hypomag - resolved Hypochloremia Contraction alkalosis   -08/14/14 BMP pending P:   Follow BMP Replete electrolytes as indicated  GASTROINTESTINAL OGT >> A:   Aspiration risk  Protein calorie malnutrition  P:   PPI  Cont TFs @ 55cc/hr, at goal Colace QD, hold for diarrhea    HEMATOLOGIC A:   Mild thrombocytopenia - etoh vs. sepsis - improving Ecchymosis  -08/14/14 no evidence of bleeding P:  Trend CBC  Monitor signs of bleeding [place] heparin   INFECTIOUS +MRSA by PCR screen BCx2 10/23>>>neg resp viral panel 10/24>>>Negative Abx: azithro 10/23>>>10/28 Abx: rocephin 10/23>>> 10/30 A:   CAP  -08/13/14 off abx. P:   Contact isolation per protocol  ENDOCRINE A:   Stable CBGs - stress and steroids causing some hyperglycemia  P:   SSI at goal < 180 Solumedrol 40mg  q12  NEUROLOGIC A:   Acute encephalopathy - failed precedex gtt Panic attack DTs  Hx seizure disorder, on dilantin at therapeutic goal 11/1 after correction  -08/14/14: still very agitated max precedex/versed/fentanyl, scheduled ativan, increased seroquel. P:   RASS goal: 0 to -2 Precedex gtt Versed gtt Discontinue Fentanyl gtt Start dilaudid gtt Ativan 2mg  TID  Increased Seroquel 200 TID Phenytoin 100mg  IV TID Cont thiamine, folate, MVI Monitor for DT's   FAMILY  Updates: wife & daughter-in-law at bedside 10/29, 10/31 by RB, 08/12/14 & 08/13/14 by MR  Inter-disciplinary family meet or Palliative Care meeting:  Discussed with family regarding goals of care.  Family wants discussion with Dr. Tyson AliasFeinstein regarding trach. Currently day 11 of ETT. Concerned he will have difficulty with weaning from mechanical vent and long term prognosis with severe COPD & ETOH abuse hx.   Tawni CarnesAndrew Michaline Kindig, MD 08/14/2014, 7:32 AM PGY-2, [facility]

## 2014-08-14 NOTE — Procedures (Signed)
Bedside Tracheostomy Insertion Procedure Note   Patient Details:   Name: Kenneth Bass DOB: [birth year] MRN: [id number]  Procedure: Tracheostomy  Pre Procedure Assessment: ET Tube Size:7.5 ET Tube secured at lip (cm):21 Bite block in place: No Breath Sounds: Clear  Post Procedure Assessment: BP 88/41 mmHg  Pulse 78  Temp(Src) 99.7 F (37.6 C) (Oral)  Resp 20  Ht 5\' 6"  (1.676 m)  Wt 125 lb 3.5 oz (56.8 kg)  BMI 20.22 kg/m2  SpO2 99% O2 sats: stable throughout Complications: No apparent complications Patient did tolerate procedure well Tracheostomy Brand:Shiley Tracheostomy Style:Cuffed Tracheostomy Size: 6 Tracheostomy Secured FAO:[id number]:Sutures Tracheostomy Placement Confirmation:Trach cuff visualized and in place and Chest X ray ordered for placement    Cherylin Mylaroyle, Hernando Reali 08/14/2014, 1:47 PM

## 2014-08-15 ENCOUNTER — Inpatient Hospital Stay (HOSPITAL_COMMUNITY): Payer: Medicare Other

## 2014-08-15 DIAGNOSIS — G9340 Encephalopathy, unspecified: Secondary | ICD-10-CM

## 2014-08-15 LAB — BASIC METABOLIC PANEL
Anion gap: 9 (ref 5–15)
BUN: 54 mg/dL — ABNORMAL HIGH (ref 6–23)
CALCIUM: 8.5 mg/dL (ref 8.4–10.5)
CO2: 38 mEq/L — ABNORMAL HIGH (ref 19–32)
Chloride: 102 mEq/L (ref 96–112)
Creatinine, Ser: 0.6 mg/dL (ref 0.50–1.35)
GFR calc Af Amer: >90 mL/min (ref >90)
GFR calc non Af Amer: >90 mL/min (ref >90)
GLUCOSE: 141 mg/dL — AB (ref 70–99)
POTASSIUM: 4.1 meq/L (ref 3.7–5.3)
Sodium: 149 mEq/L — ABNORMAL HIGH (ref 137–147)

## 2014-08-15 LAB — GLUCOSE, CAPILLARY
GLUCOSE-CAPILLARY: 152 mg/dL — AB (ref 70–99)
GLUCOSE-CAPILLARY: 120 mg/dL — AB (ref 70–99)
Glucose-Capillary: 124 mg/dL — ABNORMAL HIGH (ref 70–99)
Glucose-Capillary: 156 mg/dL — ABNORMAL HIGH (ref 70–99)
Glucose-Capillary: 173 mg/dL — ABNORMAL HIGH (ref 70–99)
Glucose-Capillary: 173 mg/dL — ABNORMAL HIGH (ref 70–99)

## 2014-08-15 LAB — CBC WITH DIFFERENTIAL/PLATELET
Basophils Absolute: 0 10*3/uL (ref 0.0–0.1)
Basophils Relative: 0 % (ref 0–1)
EOS PCT: 6 % — AB (ref 0–5)
Eosinophils Absolute: 0.5 10*3/uL (ref 0.0–0.7)
HCT: 31.3 % — ABNORMAL LOW (ref 39.0–52.0)
Hemoglobin: 10 g/dL — ABNORMAL LOW (ref 13.0–17.0)
LYMPHS ABS: 1.3 10*3/uL (ref 0.7–4.0)
LYMPHS PCT: 14 % (ref 12–46)
MCH: 33.8 pg (ref 26.0–34.0)
MCHC: 31.9 g/dL (ref 30.0–36.0)
MCV: 105.7 fL — AB (ref 78.0–100.0)
MONO ABS: 0.7 10*3/uL (ref 0.1–1.0)
Monocytes Relative: 8 % (ref 3–12)
Neutro Abs: 6.8 10*3/uL (ref 1.7–7.7)
Neutrophils Relative %: 72 % (ref 43–77)
Platelets: 201 10*3/uL (ref 150–400)
RBC: 2.96 MIL/uL — AB (ref 4.22–5.81)
RDW: 16.1 % — ABNORMAL HIGH (ref 11.5–15.5)
WBC: 9.4 10*3/uL (ref 4.0–10.5)

## 2014-08-15 LAB — MAGNESIUM: Magnesium: 1.8 mg/dL (ref 1.5–2.5)

## 2014-08-15 LAB — PHOSPHORUS: Phosphorus: 3.7 mg/dL (ref 2.3–4.6)

## 2014-08-15 MED ORDER — ACETAMINOPHEN 160 MG/5ML PO SOLN
650.0000 mg | Freq: Four times a day (QID) | ORAL | Status: DC | PRN
  Administered 2014-08-15: 650 mg via ORAL

## 2014-08-15 MED ORDER — VANCOMYCIN HCL IN DEXTROSE 750-5 MG/150ML-% IV SOLN
750.0000 mg | Freq: Two times a day (BID) | INTRAVENOUS | Status: DC
  Administered 2014-08-15 – 2014-08-20 (×10): 750 mg via INTRAVENOUS
  Filled 2014-08-15 (×11): qty 150

## 2014-08-15 MED ORDER — ACETAMINOPHEN 160 MG/5ML PO SOLN
500.0000 mg | Freq: Four times a day (QID) | ORAL | Status: DC | PRN
  Filled 2014-08-15: qty 20.3

## 2014-08-15 MED ORDER — FUROSEMIDE 10 MG/ML IJ SOLN
20.0000 mg | Freq: Three times a day (TID) | INTRAMUSCULAR | Status: DC
  Administered 2014-08-15 – 2014-08-18 (×9): 20 mg via INTRAVENOUS
  Filled 2014-08-15 (×9): qty 2

## 2014-08-15 MED ORDER — METHADONE HCL 10 MG PO TABS
10.0000 mg | ORAL_TABLET | Freq: Two times a day (BID) | ORAL | Status: DC
  Administered 2014-08-15 – 2014-08-17 (×5): 10 mg
  Filled 2014-08-15 (×5): qty 1

## 2014-08-15 NOTE — Progress Notes (Addendum)
PULMONARY / CRITICAL CARE MEDICINE   Name: Kenneth Bass MRN: [id number] DOB: 09-Dec-1950    ADMISSION DATE:  08/27/14 CONSULTATION DATE: 08/03/2014  REFERRING MD:  Criselda Peaches   CHIEF COMPLAINT:  Acute on chronic respiratory failure   INITIAL PRESENTATION: 64 y/o M smoker with PMH of COPD, ETOH and benzo dep anxiety. Admitted 10/23 w/ working dx CAP and AECOPD. Developed worsening resp failure and delirium/agitation on 10/24, intubated on 10/25 for sudden onset bronchospasm.   SIGNIFICANT EVENTS/STUDIES: 10/23  admitted 10/24  progressive resp failure, placed on BIPAP. Transferred to ICU  10/25  severe anxiety/ agitation. Desaturated and did not improve w/ BIPAP. Required emergent intubation.  10/27  off pressors 10/31  Sedate, intermittent agitation, fiO2 to 60% 11/01  Intermittent agitation, high dose fentanyl / propofol  11/02  D/C propofol, continued agitation on precedex, versed added 11/03  Cont agitation on max precedex/versed/fentanyl gtt 11/04  Trach, vanc/zosyn started, agitation improved. Lasix TID  SUBJECTIVE/OVERNIGHT EVENTS:  08/15/14: improved respiratory status on trach, agitation compared to 11/4 however RN concerned he is uncomfortable this morning; attempted WUA, body reposition.  VITAL SIGNS: Temp:  [98 F (36.7 C)-99.7 F (37.6 C)] 99.2 F (37.3 C) (11/05 0350) Pulse Rate:  [53-124] 107 (11/05 0724) Resp:  [14-40] 19 (11/05 0724) BP: (78-211)/(33-88) 117/54 mmHg (11/05 0700) SpO2:  [86 %-100 %] 97 % (11/05 0724) FiO2 (%):  [60 %-80 %] 60 % (11/05 0400) Weight:  [123 lb 3.8 oz (55.9 kg)] 123 lb 3.8 oz (55.9 kg) (11/05 0200)  HEMODYNAMICS:    VENTILATOR SETTINGS: Vent Mode:  [-] PRVC FiO2 (%):  [60 %-80 %] 60 % Set Rate:  [10 bmp-20 bmp] 10 bmp Vt Set:  [500 mL] 500 mL PEEP:  [5 cmH20-8 cmH20] 8 cmH20 Plateau Pressure:  [10 cmH20-17 cmH20] 15 cmH20  INTAKE / OUTPUT:  Intake/Output Summary (Last 24 hours) at 08/15/14 4098 Last data filed at  08/15/14 0700  Gross per 24 hour  Intake 3378.32 ml  Output   2970 ml  Net 408.32 ml    PHYSICAL EXAMINATION: General: Thin chronically ill-appearing male, appears older than stated age.  Neuro: Sedated on vent, RASS +1. HEENT: Scattered abrasions on scalp, trach collar,  Cardiovascular: RRR, normal s1/s2, no murmur or JVD Lungs: Improved air movement, still diminished bilat. Bibasilar crackles. Abdomen:  Soft, thin, nontender, + bowel sounds. Musculoskeletal:  Intact. No LE edema. Skin: Ecchymoses UE and LE  LABS: PULMONARY  Recent Labs Lab 08/13/14 0324  PHART 7.419  PCO2ART 65.2*  PO2ART 54.5*  HCO3 41.4*  TCO2 43.4  O2SAT 88.1    CBC  Recent Labs Lab 08/12/14 0414 08/14/14 1011 08/15/14 0500  HGB 10.9* 10.0* 10.0*  HCT 33.7* 32.1* 31.3*  WBC 10.7* 10.2 9.4  PLT 141* 206 201    COAGULATION  Recent Labs Lab 08/13/14 0336 08/14/14 0350  INR 1.02 1.11    CARDIAC  No results for input(s): TROPONINI in the last 168 hours.  Recent Labs Lab 08/13/14 0336  PROBNP 9178.0*     CHEMISTRY  Recent Labs Lab 08/08/14 1630  08/11/14 0503 08/12/14 0414 08/13/14 0336 08/14/14 0350 08/14/14 1205 08/15/14 0500  NA  --   < > 138 139 142  --  146 149*  K  --   < > 3.5* 3.9 3.7  --  3.9 4.1  CL  --   < > 89* 92* 93*  --  101 102  CO2  --   < > >45* 42*  39*  --  36* 38*  GLUCOSE  --   < > 126* 141* 114*  --  115* 141*  BUN  --   < > 39* 39* 42*  --  45* 54*  CREATININE  --   < > 0.53 0.50 0.49*  --  0.49* 0.60  CALCIUM  --   < > 8.7 8.6 8.9  --  8.4 8.5  MG 1.4*  --  1.3*  --  2.0 1.8  --  1.8  PHOS 4.3  --  3.5  --  3.1 3.2  --  3.7  < > = values in this interval not displayed. Estimated Creatinine Clearance: 72.8 mL/min (by C-G formula based on Cr of 0.6).   LIVER  Recent Labs Lab 08/10/14 0423 08/13/14 0336 08/14/14 0350  ALBUMIN 2.8*  --   --   INR  --  1.02 1.11    INFECTIOUS  Recent Labs Lab 08/13/14 0045  LATICACIDVEN 0.8     ENDOCRINE CBG (last 3)   Recent Labs  08/14/14 1914 08/15/14 0049 08/15/14 0351  GLUCAP 123* 156* 124*    IMAGING x48h Dg Chest Port 1 View  08/15/2014   CLINICAL DATA:  Acute respiratory failure  EXAM: PORTABLE CHEST - 1 VIEW  COMPARISON:  08/14/2014  FINDINGS: Tracheostomy has been placed since the prior study and is in good position. No pneumothorax.  Right jugular catheter tip in the SVC. Feeding tube is in place with the tip not visualized.  Severe bilateral airspace disease with basilar predominance is unchanged. Bilateral effusions unchanged.  IMPRESSION: Tracheostomy in good position  Severe diffuse bilateral airspace disease unchanged.   Electronically Signed   By: Marlan Palau M.D.   On: 08/15/2014 07:55   Dg Chest Port 1 View  08/14/2014   CLINICAL DATA:  Chest congestion.  EXAM: PORTABLE CHEST - 1 VIEW  COMPARISON:  Chest x-ray 08/13/2014.  Chest CT 05/28/2014.  FINDINGS: There is a right-sided internal jugular central venous catheter with tip terminating in the distal superior vena cava. An endotracheal tube is in place with tip 6.4 cm above the carina. A nasogastric tube is seen extending into the stomach, however, the tip of the nasogastric tube extends below the lower margin of the image. As with the recent prior examination there is diffuse interstitial prominence and patchy airspace disease, most confluent in the mid to lower lungs bilaterally. In the upper lungs there does appear to be some associated cephalization of the pulmonary vasculature. Moderate bilateral pleural effusions (right greater than left). Heart size appears borderline to mildly enlarged. The patient is rotated to the left on today's exam, resulting in distortion of the mediastinal contours and reduced diagnostic sensitivity and specificity for mediastinal pathology. Atherosclerosis in the thoracic aorta.  IMPRESSION: 1. Support apparatus, as above. 2. Overall, there slightly worsened aeration compared to  08/13/2014. Given the cephalization of the pulmonary vasculature and the overall appearance of the chest, I suspect a background of pulmonary edema, potentially related to mild congestive heart failure. 3. However, the asymmetry of interstitial and airspace disease is also concerning for severe multilobar pneumonia. Additional bibasilar opacities likely reflect passive atelectasis in the presence of bilateral pleural effusions.   Electronically Signed   By: Trudie Reed M.D.   On: 08/14/2014 08:35   Dg Abd Portable 1v  08/14/2014   CLINICAL DATA:  Assess feeding tube placement positioning  EXAM: PORTABLE ABDOMEN - 1 VIEW  COMPARISON:  None.  FINDINGS: The radiodense tip  of the feeding tube projects over the left mid to lower abdomen and may lie within a moderately distended stomach.  IMPRESSION: The feeding tube tip lies in the left mid to lower abdomen likely within the stomach but is more inferiorly positioned than usually seen.  These results will be called to the ordering clinician or representative by the Radiologist Assistant, and communication documented in the PACS or zVision Dashboard.   Electronically Signed   By: David  Swazil[place]Jordan   On: 08/14/2014 13:34   Dg Abd Portable 1v  08/14/2014   CLINICAL DATA:  Panda  placement  EXAM: PORTABLE ABDOMEN - 1 VIEW  COMPARISON:  08/14/2014  FINDINGS: Panda tip in the body the stomach, unchanged from the earlier image.  Left lower lobe atelectasis/ infiltrate and left effusion unchanged.  Normal bowel gas pattern.  IMPRESSION: Feeding tube tip in the body the stomach.   Electronically Signed   By: Marlan Palauharles  Clark M.D.   On: 08/14/2014 13:30    ASSESSMENT / PLAN:  PULMONARY OETT 10/25>>> 11/04 Trach 11/04 A: Acute hypoxic and hypercarbic respiratory failure PNA  AE-COPD - likely severe COPD  -08/15/14 improved resp status with trach  P:   Wean Fio2 as able Duoneb Q6 with Q3 PRN albuterol  Steroids 40 mg Q12 Will need O2 assessment and decision  regarding home regimen upon discharge (on no meds PTA)  CARDIOVASCULAR CVL 10/25 >> A-line 10/25 >> 10/29 A:  Sepsis - resolved off pressors Echo 08/03/14 EF 55-60%  -08/15/14 hypertension improved, stable P:  Tele monitoring PRN EKG with seroquel  RENAL A:  Hypokalemia/ hypomag - resolved Hypochloremia Contraction alkalosis   -08/15/14 slight hypernatremia, likely overdiuresis P:   Follow BMP Replete electrolytes as indicated Decrease lasix 20mg  q8hrs  GASTROINTESTINAL OGT >> A:   Aspiration risk  Protein calorie malnutrition  P:   PPI  Cont TFs @ 55cc/hr, at goal Colace QD, hold for diarrhea   HEMATOLOGIC A:   Mild thrombocytopenia, resolved  -08/15/14 no changes P:  Trend CBC  Monitor signs of bleeding [place] heparin   INFECTIOUS +MRSA by PCR screen BCx2 10/23>>>neg resp viral panel 10/24>>>Negative Abx: azithro 10/23>>>10/28 Abx: rocephin 10/23>>> 10/30 Abx: Zosyn 11/04>>> Abx: Vancomycin 11/04>>> A:   CAP  -08/15/14 improving resp status on trach and abx P:   Contact isolation per protocol Vancomycin stop date 11/10 Zosyn stop date 11/10  ENDOCRINE A:   Stable CBGs - stress and steroids causing some hyperglycemia  P:   SSI at goal < 180 Solumedrol 40mg  q12  NEUROLOGIC A:   Acute encephalopathy - failed precedex gtt Panic attack DTs  Hx seizure disorder, on dilantin at therapeutic goal 11/1 after correction  -08/15/14: unchanged, agitation improved compared to 11/4. Still appears uncomf P:   RASS goal: 0 to -2 Propofol gtt restarted 11/4 (previously on from 10/25 to 11/2), wean Versed gtt stopped Fentanyl gtt, wean Add methadone 10mg  BID, hold QTc >54600ms Ativan 2mg  TID  Seroquel 200 TID Phenytoin 100mg  IV TID Cont thiamine, folate, MVI Monitor for DT's   FAMILY  Updates: wife & daughter-in-law at bedside 10/29, 10/31 by RB, 08/12/14 & 08/13/14 by MR. 08/14/14 by DF. 08/15/14 by RA   Inter-disciplinary family meet or Palliative  Care meeting:  Discussed with family regarding goals of care. Concerned he will have difficulty with weaning from mechanical vent and long term prognosis with severe COPD & ETOH abuse hx. Family aware of likely LTAC   Tawni CarnesAndrew Wight, MD 08/15/2014, 8:08 AM PGY-2,  [facility]   ATTENDING NOTE: I have personally reviewed patient's available data, including medical history, events of note, physical examination and test results as part of my evaluation. I have discussed with resident/NP and other careteam providers such as pharmacist, RN and RRT & co-ordinated with consultants. In addition, I personally evaluated patient and elicited key history of persistent delirium,COPD exacerbation with acute respiratory failure, exam findings of persistent agitation when trips lowered, absence of bronchospasm with clear lungs and peak pressure 24 & labs showing mild hyponatremia after diuresis.  Antibiotics were broadened for HCAP/ presumed VAP Our strategy would be to add methadone to his max doses of Ativan and Seroquel, and try and taper off fentanyl drip now the tracheostomy has been obtained. He will very likely need LTAC placement. This was discussed with his wife and daughter Rest per NP/medical resident whose note is outlined above and that I agree with and edited in full.   The patient is critically ill with multiple organ systems failure and requires high complexity decision making for assessment and support, frequent evaluation and titration of therapies, application of advanced monitoring technologies and extensive interpretation of multiple databases. Critical Care Time devoted to patient care services described in this note independent of resident time is 35 minutes.   Oretha MilchALVA,Mckinsey Keagle V. MD

## 2014-08-15 NOTE — Op Note (Signed)
NAME:  Kenneth Bass, Eziah            ACCOUNT NO.:  [id number]  MEDICAL RECORD NO.:  [id number]  LOCATION:                                 FACILITY:  PHYSICIAN:  Nelda Bucksaniel J Shonte Beutler, MD DATE OF BIRTH:  1951/04/07  DATE OF PROCEDURE:  07/19/2014 DATE OF DISCHARGE:                              OPERATIVE REPORT   PROCEDURE:  Percutaneous tracheostomy.  PREOPERATIVE DIAGNOSIS:  Pneumonia, alcohol withdrawal, severe delirium, and day-11 ventilatory support.  POSTOPERATIVE DIAGNOSIS:  Status post tracheostomy secondary to severe delirium and pneumonia and alcohol withdrawal.  PROCEDURE IN DETAIL:  The consent was obtained from the patient's wife fully aware of risks and benefits of the procedure including infection, bleeding, pneumothorax, and death.  The bronchoscope was placed by the bronchoscopist through the endotracheal tube, backed up to approximately 17 cm.  The entire procedure was visualized by the bronchoscopist.  The patient required Versed, fentanyl, propofol, and vecuronium, and the patient was pre-oxygenated.  Chlorhexidine preparation was used to sterilize the operative site over the second endotracheal space.  A 6 mL of lidocaine plus epinephrine was injected into that space.  A 1 cm vertical dissection was made down the tracheal planes.  He had a very small strap muscles noted with no arterial-venous structures; and an 18- gauge needle was placed over a white catheter sheath into the airway. Needle was removed.  White catheter sheath was remained.  This was visualized, noting posterior wall injury by the bronchoscopist.  A wire was placed through the white catheter sheath and the white catheter sheath was removed.  A 14-French punch dilator was placed into the airway.  Progressive rhino dilator of approximately 30-French was placed over a glider, over the wire successfully into the airway.  Everything was removed except for the tracheostomy.  The tracheostomy was  sutured in place with 4 monofilament sutures.  Blood loss for the procedure was 5 mL.  The patient tolerated his procedure quite well with normal hemodynamics and saturations throughout.  This patient can follow up in our Percutaneous Tracheostomy Clinic by calling [phone].     Nelda Bucksaniel J Maisha Bogen, MD     DJF/MEDQ  D:  08/14/2014  T:  08/14/2014  Job:  [id number]

## 2014-08-15 NOTE — Progress Notes (Signed)
Trach consult note:  New trach X1 day. Pt remains on full vent support tolerating well. Will continue to follow for progress. No education needed at this time.

## 2014-08-15 NOTE — Progress Notes (Signed)
Patient is requiring high cuff pres.may need to check trach size.

## 2014-08-16 ENCOUNTER — Inpatient Hospital Stay (HOSPITAL_COMMUNITY): Payer: Medicare Other

## 2014-08-16 DIAGNOSIS — J9600 Acute respiratory failure, unspecified whether with hypoxia or hypercapnia: Secondary | ICD-10-CM | POA: Insufficient documentation

## 2014-08-16 LAB — BASIC METABOLIC PANEL
ANION GAP: 10 (ref 5–15)
BUN: 45 mg/dL — ABNORMAL HIGH (ref 6–23)
CALCIUM: 9 mg/dL (ref 8.4–10.5)
CO2: 37 mEq/L — ABNORMAL HIGH (ref 19–32)
Chloride: 101 mEq/L (ref 96–112)
Creatinine, Ser: 0.57 mg/dL (ref 0.50–1.35)
GFR calc Af Amer: >90 mL/min (ref >90)
GFR calc non Af Amer: >90 mL/min (ref >90)
Glucose, Bld: 109 mg/dL — ABNORMAL HIGH (ref 70–99)
POTASSIUM: 4.7 meq/L (ref 3.7–5.3)
Sodium: 148 mEq/L — ABNORMAL HIGH (ref 137–147)

## 2014-08-16 LAB — CBC WITH DIFFERENTIAL/PLATELET
BASOS ABS: 0 10*3/uL (ref 0.0–0.1)
Basophils Relative: 0 % (ref 0–1)
Eosinophils Absolute: 0.7 10*3/uL (ref 0.0–0.7)
Eosinophils Relative: 5 % (ref 0–5)
HCT: 36.6 % — ABNORMAL LOW (ref 39.0–52.0)
Hemoglobin: 11.9 g/dL — ABNORMAL LOW (ref 13.0–17.0)
Lymphocytes Relative: 12 % (ref 12–46)
Lymphs Abs: 2 10*3/uL (ref 0.7–4.0)
MCH: 33.8 pg (ref 26.0–34.0)
MCHC: 32.5 g/dL (ref 30.0–36.0)
MCV: 104 fL — ABNORMAL HIGH (ref 78.0–100.0)
Monocytes Absolute: 1.1 10*3/uL — ABNORMAL HIGH (ref 0.1–1.0)
Monocytes Relative: 7 % (ref 3–12)
NEUTROS ABS: 12.2 10*3/uL — AB (ref 1.7–7.7)
NEUTROS PCT: 76 % (ref 43–77)
Platelets: 254 10*3/uL (ref 150–400)
RBC: 3.52 MIL/uL — AB (ref 4.22–5.81)
RDW: 16.3 % — AB (ref 11.5–15.5)
WBC: 16.1 10*3/uL — ABNORMAL HIGH (ref 4.0–10.5)

## 2014-08-16 LAB — GLUCOSE, CAPILLARY
GLUCOSE-CAPILLARY: 113 mg/dL — AB (ref 70–99)
GLUCOSE-CAPILLARY: 174 mg/dL — AB (ref 70–99)
GLUCOSE-CAPILLARY: 183 mg/dL — AB (ref 70–99)
Glucose-Capillary: 126 mg/dL — ABNORMAL HIGH (ref 70–99)
Glucose-Capillary: 176 mg/dL — ABNORMAL HIGH (ref 70–99)
Glucose-Capillary: 155 mg/dL — ABNORMAL HIGH (ref 70–99)

## 2014-08-16 LAB — PHENYTOIN LEVEL, TOTAL: PHENYTOIN LVL: 4.6 ug/mL — AB (ref 10.0–20.0)

## 2014-08-16 LAB — ALBUMIN: Albumin: 2.5 g/dL — ABNORMAL LOW (ref 3.5–5.2)

## 2014-08-16 NOTE — Progress Notes (Signed)
UR Completed.  Kenneth Bass [name] [phone] 08/16/2014  

## 2014-08-16 NOTE — Progress Notes (Addendum)
PULMONARY / CRITICAL CARE MEDICINE   Name: Avon GullyFrancis J Dorce MRN: [id number] DOB: 1951-04-02    ADMISSION DATE:  07/18/2014 CONSULTATION DATE: 08/03/2014  REFERRING MD:  Criselda PeachesMullen   CHIEF COMPLAINT:  Acute on chronic respiratory failure   INITIAL PRESENTATION: 63 y/o M smoker with PMH of COPD, ETOH and benzo dep anxiety. Admitted 10/23 w/ working dx CAP and AECOPD. Developed worsening resp failure and delirium/agitation on 10/24, intubated on 10/25 for sudden onset bronchospasm.   SIGNIFICANT EVENTS/STUDIES: 10/23  admitted 10/24  progressive resp failure, placed on BIPAP. Transferred to ICU  10/25  severe anxiety/ agitation. Desaturated and did not improve w/ BIPAP. Required emergent intubation.  10/27  off pressors 10/31  Sedate, intermittent agitation, fiO2 to 60% 11/01  Intermittent agitation, high dose fentanyl / propofol  11/02  D/C propofol, continued agitation on precedex, versed added 11/03  Cont agitation on max precedex/versed/fentanyl gtt 11/04  Trach, vanc/zosyn started, agitation improved. Lasix TID 11/05 improved resp status on trach. Added methadone, attempting to wean drips. Febrile 101F  SUBJECTIVE/OVERNIGHT EVENTS:  08/16/14 RN reports about same as yesterday, attempting to wean drips but having difficulty with agitation.   VITAL SIGNS: Temp:  [97.5 F (36.4 C)-101 F (38.3 C)] 99.5 F (37.5 C) (11/06 0400) Pulse Rate:  [92-127] 104 (11/06 0350) Resp:  [12-25] 22 (11/06 0400) BP: (94-200)/(53-99) 144/71 mmHg (11/06 0400) SpO2:  [90 %-96 %] 94 % (11/06 0752) FiO2 (%):  [50 %-100 %] 100 % (11/06 0752)  HEMODYNAMICS:    VENTILATOR SETTINGS: Vent Mode:  [-] PRVC FiO2 (%):  [50 %-100 %] 100 % Set Rate:  [11 bmp-22 bmp] 22 bmp Vt Set:  [500 mL] 500 mL PEEP:  [5 cmH20-8 cmH20] 8 cmH20 Plateau Pressure:  [16 cmH20-27 cmH20] 16 cmH20  INTAKE / OUTPUT:  Intake/Output Summary (Last 24 hours) at 08/16/14 0753 Last data filed at 08/16/14 0400  Gross per 24  hour  Intake 2619.04 ml  Output   4075 ml  Net -1455.96 ml    PHYSICAL EXAMINATION: General: Thin chronically ill-appearing male, appears older than stated age.  Neuro: Sedated on vent, RASS +1. HEENT: Scattered abrasions on scalp, trach collar,  Cardiovascular: RRR, normal s1/s2, no murmur or JVD Lungs: Improved air movement, still diminished bilat. Bibasilar crackles. Abdomen:  Soft, thin, nontender, + bowel sounds. Musculoskeletal:  Intact. No LE edema. Skin: Ecchymoses UE and LE  LABS: PULMONARY  Recent Labs Lab 08/13/14 0324  PHART 7.419  PCO2ART 65.2*  PO2ART 54.5*  HCO3 41.4*  TCO2 43.4  O2SAT 88.1    CBC  Recent Labs Lab 08/14/14 1011 08/15/14 0500 08/16/14 0456  HGB 10.0* 10.0* 11.9*  HCT 32.1* 31.3* 36.6*  WBC 10.2 9.4 16.1*  PLT 206 201 254    COAGULATION  Recent Labs Lab 08/13/14 0336 08/14/14 0350  INR 1.02 1.11    CARDIAC  No results for input(s): TROPONINI in the last 168 hours.  Recent Labs Lab 08/13/14 0336  PROBNP 9178.0*     CHEMISTRY  Recent Labs Lab 08/11/14 0503 08/12/14 0414 08/13/14 0336 08/14/14 0350 08/14/14 1205 08/15/14 0500 08/16/14 0456  NA 138 139 142  --  146 149* 148*  K 3.5* 3.9 3.7  --  3.9 4.1 4.7  CL 89* 92* 93*  --  101 102 101  CO2 >45* 42* 39*  --  36* 38* 37*  GLUCOSE 126* 141* 114*  --  115* 141* 109*  BUN 39* 39* 42*  --  45* 54* 45*  CREATININE 0.53 0.50 0.49*  --  0.49* 0.60 0.57  CALCIUM 8.7 8.6 8.9  --  8.4 8.5 9.0  MG 1.3*  --  2.0 1.8  --  1.8  --   PHOS 3.5  --  3.1 3.2  --  3.7  --    Estimated Creatinine Clearance: 72.8 mL/min (by C-G formula based on Cr of 0.57).   LIVER  Recent Labs Lab 08/10/14 0423 08/13/14 0336 08/14/14 0350 08/16/14 0500  ALBUMIN 2.8*  --   --  2.5*  INR  --  1.02 1.11  --     INFECTIOUS  Recent Labs Lab 08/13/14 0045  LATICACIDVEN 0.8    ENDOCRINE CBG (last 3)   Recent Labs  08/15/14 2041 08/16/14 0059 08/16/14 0451  GLUCAP 173*  174* 126*    IMAGING x48h Dg Chest Port 1 View  08/16/2014   CLINICAL DATA:  Check tracheostomy tube placement  EXAM: PORTABLE CHEST - 1 VIEW  COMPARISON:  08/15/2014  FINDINGS: Cardiac shadow is stable. Tracheostomy tube and feeding catheter are again noted and stable. A right-sided jugular central line is seen with the catheter tip in the distal superior vena cava. Diffuse bilateral infiltrates are again seen. The overall appearance is stable from the prior exam. No new focal abnormality is seen.  IMPRESSION: Stable appearance of the chest.   Electronically Signed   By: Alcide Clever M.D.   On: 08/16/2014 07:12   Dg Chest Port 1 View  08/15/2014   CLINICAL DATA:  Acute respiratory failure  EXAM: PORTABLE CHEST - 1 VIEW  COMPARISON:  08/14/2014  FINDINGS: Tracheostomy has been placed since the prior study and is in good position. No pneumothorax.  Right jugular catheter tip in the SVC. Feeding tube is in place with the tip not visualized.  Severe bilateral airspace disease with basilar predominance is unchanged. Bilateral effusions unchanged.  IMPRESSION: Tracheostomy in good position  Severe diffuse bilateral airspace disease unchanged.   Electronically Signed   By: Marlan Palau M.D.   On: 08/15/2014 07:55   Dg Abd Portable 1v  08/15/2014   CLINICAL DATA:  Encounter for feeding tube placement.  EXAM: PORTABLE ABDOMEN - 1 VIEW  COMPARISON:  08/14/2014  FINDINGS: Anatomy is distorted by leftward rotation.  Lengthened feeding tube, with tip in the region of the gastric body.  No evidence for bowel obstruction. Dense lower lung opacities again noted.  IMPRESSION: Feeding tube tip at the gastric body.   Electronically Signed   By: Tiburcio Pea M.D.   On: 08/15/2014 19:31   Dg Abd Portable 1v  08/14/2014   CLINICAL DATA:  Assess feeding tube placement positioning  EXAM: PORTABLE ABDOMEN - 1 VIEW  COMPARISON:  None.  FINDINGS: The radiodense tip of the feeding tube projects over the left mid to lower  abdomen and may lie within a moderately distended stomach.  IMPRESSION: The feeding tube tip lies in the left mid to lower abdomen likely within the stomach but is more inferiorly positioned than usually seen.  These results will be called to the ordering clinician or representative by the Radiologist Assistant, and communication documented in the PACS or zVision Dashboard.   Electronically Signed   By: David  [place]   On: 08/14/2014 13:34   Dg Abd Portable 1v  08/14/2014   CLINICAL DATA:  Panda  placement  EXAM: PORTABLE ABDOMEN - 1 VIEW  COMPARISON:  08/14/2014  FINDINGS: Panda tip in the body the stomach, unchanged from the earlier  image.  Left lower lobe atelectasis/ infiltrate and left effusion unchanged.  Normal bowel gas pattern.  IMPRESSION: Feeding tube tip in the body the stomach.   Electronically Signed   By: Marlan Palauharles  Clark M.D.   On: 08/14/2014 13:30    ASSESSMENT / PLAN:  NEUROLOGIC A:   Acute encephalopathy - failed precedex gtt Panic attack DTs  Hx seizure disorder, on dilantin at therapeutic goal 11/1 after correction  -08/16/14 still attempting weaning drips, fentanyl down from 45100mcg/hr to 27700mcg/hr  P:   RASS goal: 0 to -2 Propofol gtt restarted 11/4 (previously on from 10/25 to 11/2), wean Fentanyl gtt, wean Methadone 10mg  BID, hold QTc >53600ms Ativan 2mg  TID  Seroquel 200 TID Phenytoin 100mg  IV TID Cont thiamine, folate, MVI Monitor for DT's  PULMONARY OETT 10/25>>> 11/04 Trach 11/04 A: Acute hypoxic and hypercarbic respiratory failure PNA  AE-COPD - likely severe COPD  -08/16/14 stable on trach  P:   Wean Fio2 as able Duoneb Q6 with Q3 PRN albuterol  Steroids 40 mg Q12 Will need O2 assessment and decision regarding home regimen upon discharge (on no meds PTA)  CARDIOVASCULAR CVL 10/25 >> A-line 10/25 >> 10/29 A:  Sepsis - resolved off pressors Echo 08/03/14 EF 55-60%  -08/16/14 hypertensive, again likely from agitation  P:  Tele monitoring PRN  EKG with seroquel  RENAL A:  Hypokalemia/ hypomag - resolved Hypochloremia - resolved Contraction alkalosis   -08/16/14 hypernatremia stable, bicarb stable/decreasing. Maintaining good diuresis.  P:   Follow BMP Replete electrolytes as indicated Lasix 20mg  q8hrs  GASTROINTESTINAL OGT >> A:   Aspiration risk  Protein calorie malnutrition  -08/16/14 last BM reported on 11/3 P:   PPI  Cont TFs @ 55cc/hr, at goal Colace QD, hold for diarrhea   HEMATOLOGIC A:   Mild thrombocytopenia, resolved  -08/16/14 plts continue to improve. Hgb stable.  P:  Trend CBC  Monitor signs of bleeding [place] heparin   INFECTIOUS +MRSA by PCR screen BCx2 10/23>>>neg resp viral panel 10/24>>>Negative Abx: azithro 10/23>>>10/28 Abx: rocephin 10/23>>> 10/30 Abx: Zosyn 11/04>>> Abx: Vancomycin 11/04>>> A:   CAP  -08/16/14 neutrophil predominant leukocytosis (WBC 9.4>>16.1), some attributed to volume reduction P:   Contact isolation per protocol Vancomycin stop date 11/10 Zosyn stop date 11/10  ENDOCRINE A:   Stable CBGs - stress and steroids causing some hyperglycemia  P:   SSI at goal < 180 Solumedrol 40mg  q12   FAMILY  Updates: wife & daughter-in-law at bedside 10/29, 10/31 by RB, 08/12/14 & 08/13/14 by MR. 08/14/14 by DF. 08/15/14 by RA   Inter-disciplinary family meet or Palliative Care meeting:  Discussed with family regarding goals of care. Concerned he will have difficulty with weaning from mechanical vent and long term prognosis with severe COPD & ETOH abuse hx. Family aware of long term care needed.  Tawni CarnesAndrew Rodolfo Notaro, MD 08/16/2014, 7:53 AM PGY-2,  Family Medicine

## 2014-08-16 NOTE — Progress Notes (Signed)
Wasted 20 ml of versed in sink. Witnessed by Garnette CzechLexi Potter RN

## 2014-08-17 ENCOUNTER — Inpatient Hospital Stay (HOSPITAL_COMMUNITY): Payer: Medicare Other

## 2014-08-17 LAB — GLUCOSE, CAPILLARY
GLUCOSE-CAPILLARY: 182 mg/dL — AB (ref 70–99)
GLUCOSE-CAPILLARY: 166 mg/dL — AB (ref 70–99)
GLUCOSE-CAPILLARY: 145 mg/dL — AB (ref 70–99)
Glucose-Capillary: 127 mg/dL — ABNORMAL HIGH (ref 70–99)
Glucose-Capillary: 118 mg/dL — ABNORMAL HIGH (ref 70–99)
Glucose-Capillary: 131 mg/dL — ABNORMAL HIGH (ref 70–99)

## 2014-08-17 LAB — CBC WITH DIFFERENTIAL/PLATELET
BASOS PCT: 0 % (ref 0–1)
Basophils Absolute: 0 10*3/uL (ref 0.0–0.1)
Eosinophils Absolute: 1 10*3/uL — ABNORMAL HIGH (ref 0.0–0.7)
Eosinophils Relative: 7 % — ABNORMAL HIGH (ref 0–5)
HCT: 33.3 % — ABNORMAL LOW (ref 39.0–52.0)
HEMOGLOBIN: 10.4 g/dL — AB (ref 13.0–17.0)
LYMPHS ABS: 1.6 10*3/uL (ref 0.7–4.0)
Lymphocytes Relative: 12 % (ref 12–46)
MCH: 33 pg (ref 26.0–34.0)
MCHC: 31.2 g/dL (ref 30.0–36.0)
MCV: 105.7 fL — ABNORMAL HIGH (ref 78.0–100.0)
Monocytes Absolute: 0.9 10*3/uL (ref 0.1–1.0)
Monocytes Relative: 7 % (ref 3–12)
NEUTROS ABS: 10 10*3/uL — AB (ref 1.7–7.7)
NEUTROS PCT: 74 % (ref 43–77)
Platelets: 230 10*3/uL (ref 150–400)
RBC: 3.15 MIL/uL — ABNORMAL LOW (ref 4.22–5.81)
RDW: 16.2 % — ABNORMAL HIGH (ref 11.5–15.5)
WBC: 13.5 10*3/uL — AB (ref 4.0–10.5)

## 2014-08-17 LAB — BASIC METABOLIC PANEL
ANION GAP: 8 (ref 5–15)
BUN: 41 mg/dL — AB (ref 6–23)
CALCIUM: 8.3 mg/dL — AB (ref 8.4–10.5)
CHLORIDE: 99 meq/L (ref 96–112)
CO2: 36 mEq/L — ABNORMAL HIGH (ref 19–32)
CREATININE: 0.58 mg/dL (ref 0.50–1.35)
GFR calc Af Amer: >90 mL/min (ref >90)
Glucose, Bld: 120 mg/dL — ABNORMAL HIGH (ref 70–99)
Potassium: 3.5 mEq/L — ABNORMAL LOW (ref 3.7–5.3)
Sodium: 143 mEq/L (ref 137–147)

## 2014-08-17 LAB — MAGNESIUM
Magnesium: 1.4 mg/dL — ABNORMAL LOW (ref 1.5–2.5)
Magnesium: 2.6 mg/dL — ABNORMAL HIGH (ref 1.5–2.5)

## 2014-08-17 LAB — PHOSPHORUS: Phosphorus: 3.4 mg/dL (ref 2.3–4.6)

## 2014-08-17 LAB — TRIGLYCERIDES: TRIGLYCERIDES: 120 mg/dL (ref <150)

## 2014-08-17 MED ORDER — SODIUM CHLORIDE 0.9 % IV SOLN
6.0000 g | Freq: Once | INTRAVENOUS | Status: AC
  Administered 2014-08-17: 6 g via INTRAVENOUS
  Filled 2014-08-17: qty 12

## 2014-08-17 MED ORDER — POTASSIUM CHLORIDE 20 MEQ/15ML (10%) PO SOLN
20.0000 meq | ORAL | Status: AC
  Administered 2014-08-17 (×2): 20 meq via ORAL
  Filled 2014-08-17: qty 15

## 2014-08-17 MED ORDER — METHYLPREDNISOLONE SODIUM SUCC 40 MG IJ SOLR
40.0000 mg | Freq: Every day | INTRAMUSCULAR | Status: DC
  Administered 2014-08-18 – 2014-08-19 (×2): 40 mg via INTRAVENOUS
  Filled 2014-08-17 (×2): qty 1

## 2014-08-17 MED ORDER — POTASSIUM CHLORIDE CRYS ER 20 MEQ PO TBCR: 20.0000 meq | EXTENDED_RELEASE_TABLET | ORAL | Status: DC

## 2014-08-17 MED ORDER — METHADONE HCL 10 MG PO TABS
20.0000 mg | ORAL_TABLET | Freq: Two times a day (BID) | ORAL | Status: DC
  Administered 2014-08-17 – 2014-08-18 (×2): 20 mg
  Filled 2014-08-17 (×2): qty 2

## 2014-08-17 NOTE — Progress Notes (Signed)
PULMONARY / CRITICAL CARE MEDICINE   Name: Kenneth GullyFrancis J Goodchild MRN: [id number] DOB: Dec 27, 1950    ADMISSION DATE:  07/12/2014 CONSULTATION DATE: 08/03/2014  REFERRING MD:  Criselda PeachesMullen   CHIEF COMPLAINT:  Acute on chronic respiratory failure   INITIAL PRESENTATION: 63 y/o M smoker with PMH of COPD, ETOH and benzo dep anxiety. Admitted 10/23 w/ working dx CAP and AECOPD. Developed worsening resp failure and delirium/agitation on 10/24, intubated on 10/25 for sudden onset bronchospasm.   SIGNIFICANT EVENTS/STUDIES: 10/23  admitted 10/24  progressive resp failure, placed on BIPAP. Transferred to ICU  10/25  severe anxiety/ agitation. Desaturated and did not improve w/ BIPAP. Required emergent intubation.  10/27  off pressors 10/31  Sedate, intermittent agitation, fiO2 to 60% 11/01  Intermittent agitation, high dose fentanyl / propofol  11/02  D/C propofol, continued agitation on precedex, versed added 11/03  Cont agitation on max precedex/versed/fentanyl gtt 11/04  Trach, vanc/zosyn started, agitation improved. Lasix TID 11/05 improved resp status on trach. Added methadone, attempting to wean drips. Febrile 101F 11/7 increased methadone  SUBJECTIVE/OVERNIGHT EVENTS:  11/7 remains on 30 mcg of diprivan  VITAL SIGNS: Temp:  [98.7 F (37.1 C)-100.5 F (38.1 C)] 98.8 F (37.1 C) (11/07 1211) Pulse Rate:  [80-120] 92 (11/07 1100) Resp:  [13-25] 20 (11/07 1100) BP: (72-172)/(36-95) 72/36 mmHg (11/07 1119) SpO2:  [87 %-100 %] 94 % (11/07 1100) FiO2 (%):  [50 %-100 %] 50 % (11/07 1119) Weight:  [117 lb 15.1 oz (53.5 kg)-119 lb 11.4 oz (54.3 kg)] 117 lb 15.1 oz (53.5 kg) (11/07 0500)  HEMODYNAMICS:    VENTILATOR SETTINGS: Vent Mode:  [-] PRVC FiO2 (%):  [50 %-100 %] 50 % Set Rate:  [22 bmp] 22 bmp Vt Set:  [500 mL] 500 mL PEEP:  [8 cmH20] 8 cmH20 Plateau Pressure:  [17 cmH20-22 cmH20] 17 cmH20  INTAKE / OUTPUT:  Intake/Output Summary (Last 24 hours) at 08/17/14 1229 Last data  filed at 08/17/14 1100  Gross per 24 hour  Intake 2686.61 ml  Output   2700 ml  Net -13.39 ml    PHYSICAL EXAMINATION: General: Thin chronically ill-appearing male, appears older than stated age.  Neuro: Sedated on vent, RASS +1. Tracked briefly HEENT: Scattered abrasions on scalp, trach collar,  Cardiovascular: RRR, normal s1/s2, no murmur or JVD Lungs: Diminshed air movement, still diminished bilat.  Abdomen:  Soft, thin, nontender, + bowel sounds. Musculoskeletal:  Intact. No LE edema. Skin: Ecchymoses UE and LE  LABS: PULMONARY  Recent Labs Lab 08/13/14 0324  PHART 7.419  PCO2ART 65.2*  PO2ART 54.5*  HCO3 41.4*  TCO2 43.4  O2SAT 88.1    CBC  Recent Labs Lab 08/15/14 0500 08/16/14 0456 08/17/14 0420  HGB 10.0* 11.9* 10.4*  HCT 31.3* 36.6* 33.3*  WBC 9.4 16.1* 13.5*  PLT 201 254 230    COAGULATION  Recent Labs Lab 08/13/14 0336 08/14/14 0350  INR 1.02 1.11    CARDIAC  No results for input(s): TROPONINI in the last 168 hours.  Recent Labs Lab 08/13/14 0336  PROBNP 9178.0*     CHEMISTRY  Recent Labs Lab 08/11/14 0503  08/13/14 0336 08/14/14 0350 08/14/14 1205 08/15/14 0500 08/16/14 0456 08/17/14 0420  NA 138  < > 142  --  146 149* 148* 143  K 3.5*  < > 3.7  --  3.9 4.1 4.7 3.5*  CL 89*  < > 93*  --  101 102 101 99  CO2 >45*  < > 39*  --  36* 38* 37* 36*  GLUCOSE 126*  < > 114*  --  115* 141* 109* 120*  BUN 39*  < > 42*  --  45* 54* 45* 41*  CREATININE 0.53  < > 0.49*  --  0.49* 0.60 0.57 0.58  CALCIUM 8.7  < > 8.9  --  8.4 8.5 9.0 8.3*  MG 1.3*  --  2.0 1.8  --  1.8  --  1.4*  PHOS 3.5  --  3.1 3.2  --  3.7  --  3.4  < > = values in this interval not displayed. Estimated Creatinine Clearance: 69.7 mL/min (by C-G formula based on Cr of 0.58).   LIVER  Recent Labs Lab 08/13/14 0336 08/14/14 0350 08/16/14 0500  ALBUMIN  --   --  2.5*  INR 1.02 1.11  --     INFECTIOUS  Recent Labs Lab 08/13/14 0045  LATICACIDVEN 0.8     ENDOCRINE CBG (last 3)   Recent Labs  08/16/14 2321 08/17/14 0742 08/17/14 1158  GLUCAP 182* 166* 145*    IMAGING x48h Dg Chest Port 1 View  08/17/2014   CLINICAL DATA:  63 year old male with COPD currently admitted for respiratory failure. A subsequent encounter.  EXAM: PORTABLE CHEST - 1 VIEW  COMPARISON:  Multiple recent prior chest x-rays most recently 08/16/2014  FINDINGS: Stable position of tracheostomy tube. Tip is midline and at the level of the clavicles. A weighted tip enteric feeding tube is present. The tip of the tube overlies the gastric fundus. Right IJ central venous catheter with the tip overlying the distal SVC. Cardiac and mediastinal contours are unchanged. Persistent bilateral layering pleural effusions and bibasilar atelectasis. Perhaps slightly increased crest that perhaps slightly improved aeration in the right base. Persistent irregular airspace disease in the left mid and lower lung. Background changes of severe emphysema. No pneumothorax. No new acute abnormality.  IMPRESSION: 1. Perhaps slightly improved aeration in the right lung base. Otherwise, no significant interval change in the appearance of the chest. 2. The tip of the enteric feeding tube overlies the gastric fundus. If transpyloric feeding is clinically desired, recommend advancing the tube. 3. Other support apparatus remain in satisfactory position.   Electronically Signed   By: Malachy Moan M.D.   On: 08/17/2014 07:33   Dg Chest Port 1 View  08/16/2014   CLINICAL DATA:  Check tracheostomy tube placement  EXAM: PORTABLE CHEST - 1 VIEW  COMPARISON:  08/15/2014  FINDINGS: Cardiac shadow is stable. Tracheostomy tube and feeding catheter are again noted and stable. A right-sided jugular central line is seen with the catheter tip in the distal superior vena cava. Diffuse bilateral infiltrates are again seen. The overall appearance is stable from the prior exam. No new focal abnormality is seen.   IMPRESSION: Stable appearance of the chest.   Electronically Signed   By: Alcide Clever M.D.   On: 08/16/2014 07:12   Dg Abd Portable 1v  08/15/2014   CLINICAL DATA:  Encounter for feeding tube placement.  EXAM: PORTABLE ABDOMEN - 1 VIEW  COMPARISON:  08/14/2014  FINDINGS: Anatomy is distorted by leftward rotation.  Lengthened feeding tube, with tip in the region of the gastric body.  No evidence for bowel obstruction. Dense lower lung opacities again noted.  IMPRESSION: Feeding tube tip at the gastric body.   Electronically Signed   By: Tiburcio Pea M.D.   On: 08/15/2014 19:31    ASSESSMENT / PLAN:  NEUROLOGIC A:   Acute encephalopathy -  failed precedex gtt Panic attack DTs  Hx seizure disorder, on dilantin at therapeutic goal 11/1 after correction  -08/17/14 still attempting weaning drips, increase methadone  P:   RASS goal: 0 to -2 Propofol gtt restarted 11/4 (previously on from 10/25 to 11/2), wean Fentanyl gtt, wean Methadone 20mg  BID, hold QTc >5800ms Ativan 2mg  TID  Seroquel 200 TID Phenytoin 100mg  IV TID Cont thiamine, folate, MVI Monitor for DT's  PULMONARY OETT 10/25>>> 11/04 Trach 11/04 A: Acute hypoxic and hypercarbic respiratory failure PNA  AE-COPD - likely severe COPD  -08/16/14 stable on trach  P:   Wean Fio2 as able Duoneb Q6 with Q3 PRN albuterol  Steroids 40 mg Q12, decreased to qd 11/7 Will need O2 assessment and decision regarding home regimen upon discharge (on no meds PTA)  CARDIOVASCULAR CVL 10/25 >> A-line 10/25 >> 10/29 A:  Sepsis - resolved off pressors Echo 08/03/14 EF 55-60%    P:  Tele monitoring PRN EKG with seroquel  RENAL A:  Hypokalemia/ hypomag - resolved Hypochloremia - resolved Contraction alkalosis   -08/17/14 hypernatremia resolved, bicarb stable/decreasing. Maintaining good diuresis.  P:   Follow BMP Replete electrolytes as indicated Lasix 20mg  q8hrs  GASTROINTESTINAL OGT >> A:   Aspiration risk  Protein  calorie malnutrition  -08/16/14 last BM reported on 11/3 P:   PPI  Cont TFs @ 55cc/hr, at goal Colace QD, hold for diarrhea   HEMATOLOGIC A:   Mild thrombocytopenia, resolved  -08/17/14 plts continue to improve. Hgb stable. Resolved  P:  Trend CBC  Monitor signs of bleeding [place] heparin   INFECTIOUS +MRSA by PCR screen BCx2 10/23>>>neg resp viral panel 10/24>>>Negative Abx: azithro 10/23>>>10/28 Abx: rocephin 10/23>>> 10/30 Abx: Zosyn 11/04>>> Abx: Vancomycin 11/04>>> A:   CAP  -08/16/14 neutrophil predominant leukocytosis (WBC 9.4>>16.1), some attributed to volume reduction P:   Contact isolation per protocol Vancomycin stop date 11/10 Zosyn stop date 11/10  ENDOCRINE A:   Stable CBGs - stress and steroids causing some hyperglycemia  P:   SSI at goal < 180 Solumedrol 40mg  q12   FAMILY  Updates: wife & daughter-in-law at bedside 10/29, 10/31 by RB, 08/12/14 & 08/13/14 by MR. 08/14/14 by DF. 08/15/14 by RA , 11/7 per MR  Inter-disciplinary family meet or Palliative Care meeting:  Discussed with family regarding goals of care. Concerned he will have difficulty with weaning from mechanical vent and long term prognosis with severe COPD & ETOH abuse hx. Family aware of long term care needed.  Pursue LTAC options in future.  Brett CanalesSteve Tonny Isensee ACNP [facility] Pager [phone] till 3 pm If no answer page [phone] 08/17/2014, 12:30 PM

## 2014-08-17 NOTE — Progress Notes (Signed)
Patient sats were 85% upon entering room.  Performed recruitment maneuver for 2 minutes with an increase in sats to 93%.  RT will continue to monitor.

## 2014-08-18 DIAGNOSIS — Z930 Tracheostomy status: Secondary | ICD-10-CM

## 2014-08-18 DIAGNOSIS — G9340 Encephalopathy, unspecified: Secondary | ICD-10-CM

## 2014-08-18 DIAGNOSIS — E870 Hyperosmolality and hypernatremia: Secondary | ICD-10-CM

## 2014-08-18 DIAGNOSIS — J9620 Acute and chronic respiratory failure, unspecified whether with hypoxia or hypercapnia: Secondary | ICD-10-CM

## 2014-08-18 LAB — PHOSPHORUS: Phosphorus: 3.8 mg/dL (ref 2.3–4.6)

## 2014-08-18 LAB — CBC WITH DIFFERENTIAL/PLATELET
BASOS ABS: 0 10*3/uL (ref 0.0–0.1)
Basophils Relative: 0 % (ref 0–1)
EOS PCT: 12 % — AB (ref 0–5)
Eosinophils Absolute: 1.2 10*3/uL — ABNORMAL HIGH (ref 0.0–0.7)
HEMATOCRIT: 32.6 % — AB (ref 39.0–52.0)
Hemoglobin: 10.1 g/dL — ABNORMAL LOW (ref 13.0–17.0)
LYMPHS ABS: 1.3 10*3/uL (ref 0.7–4.0)
Lymphocytes Relative: 13 % (ref 12–46)
MCH: 32.7 pg (ref 26.0–34.0)
MCHC: 31 g/dL (ref 30.0–36.0)
MCV: 105.5 fL — AB (ref 78.0–100.0)
Monocytes Absolute: 0.7 10*3/uL (ref 0.1–1.0)
Monocytes Relative: 8 % (ref 3–12)
Neutro Abs: 6.6 10*3/uL (ref 1.7–7.7)
Neutrophils Relative %: 67 % (ref 43–77)
Platelets: 211 10*3/uL (ref 150–400)
RBC: 3.09 MIL/uL — ABNORMAL LOW (ref 4.22–5.81)
RDW: 16.2 % — AB (ref 11.5–15.5)
WBC: 9.8 10*3/uL (ref 4.0–10.5)

## 2014-08-18 LAB — GLUCOSE, CAPILLARY
GLUCOSE-CAPILLARY: 109 mg/dL — AB (ref 70–99)
GLUCOSE-CAPILLARY: 133 mg/dL — AB (ref 70–99)
Glucose-Capillary: 113 mg/dL — ABNORMAL HIGH (ref 70–99)
Glucose-Capillary: 115 mg/dL — ABNORMAL HIGH (ref 70–99)
Glucose-Capillary: 141 mg/dL — ABNORMAL HIGH (ref 70–99)
Glucose-Capillary: 163 mg/dL — ABNORMAL HIGH (ref 70–99)

## 2014-08-18 LAB — BASIC METABOLIC PANEL
Anion gap: 7 (ref 5–15)
BUN: 51 mg/dL — ABNORMAL HIGH (ref 6–23)
CO2: 38 meq/L — AB (ref 19–32)
Calcium: 8.7 mg/dL (ref 8.4–10.5)
Chloride: 105 mEq/L (ref 96–112)
Creatinine, Ser: 0.65 mg/dL (ref 0.50–1.35)
GFR calc Af Amer: >90 mL/min (ref >90)
Glucose, Bld: 124 mg/dL — ABNORMAL HIGH (ref 70–99)
POTASSIUM: 4.2 meq/L (ref 3.7–5.3)
Sodium: 150 mEq/L — ABNORMAL HIGH (ref 137–147)

## 2014-08-18 LAB — CK: Total CK: 38 U/L (ref 7–232)

## 2014-08-18 LAB — LACTIC ACID, PLASMA: Lactic Acid, Venous: 0.6 mmol/L (ref 0.5–2.2)

## 2014-08-18 LAB — MAGNESIUM: Magnesium: 2.3 mg/dL (ref 1.5–2.5)

## 2014-08-18 MED ORDER — METHADONE HCL 10 MG PO TABS
10.0000 mg | ORAL_TABLET | Freq: Two times a day (BID) | ORAL | Status: DC
  Administered 2014-08-18 – 2014-08-19 (×2): 10 mg
  Filled 2014-08-18 (×2): qty 1

## 2014-08-18 MED ORDER — FREE WATER
100.0000 mL | Freq: Three times a day (TID) | Status: DC
  Administered 2014-08-18 – 2014-08-21 (×10): 100 mL

## 2014-08-18 MED ORDER — LORAZEPAM 1 MG PO TABS
3.0000 mg | ORAL_TABLET | Freq: Three times a day (TID) | ORAL | Status: DC
  Administered 2014-08-18 – 2014-08-19 (×6): 3 mg
  Filled 2014-08-18 (×6): qty 3

## 2014-08-18 MED ORDER — FUROSEMIDE 10 MG/ML IJ SOLN
20.0000 mg | Freq: Every day | INTRAMUSCULAR | Status: DC
Start: 2014-08-19 — End: 2014-08-18

## 2014-08-18 NOTE — Progress Notes (Signed)
ANTIBIOTIC CONSULT NOTE - FOLLOW UP  Pharmacy Consult for Vancomycin and  Indication: pneumonia  No Known Allergies  Patient Measurements: Height: 5' 6" (167.6 cm) Weight: 117 lb 15.1 oz (53.5 kg) IBW/kg (Calculated) : 63.8  Vital Signs: Temp: 98.3 F (36.8 C) (11/08 1203) Temp Source: Oral (11/08 1203) BP: 108/65 mmHg (11/08 1200) Pulse Rate: 91 (11/08 1200) Intake/Output from previous day: 11/07 0701 - 11/08 0700 In: 2877.7 [I.V.:1162.7; NG/GT:1265; IV Piggyback:450] Out: 2575 [Urine:2575] Intake/Output from this shift: Total I/O In: 856.6 [I.V.:206.6; NG/GT:475; IV Piggyback:175] Out: 950 [Urine:950]  Labs:  Recent Labs  08/16/14 0456 08/17/14 0420 08/18/14 0441  WBC 16.1* 13.5* 9.8  HGB 11.9* 10.4* 10.1*  PLT 254 230 211  CREATININE 0.57 0.58 0.65   Estimated Creatinine Clearance: 69.7 mL/min (by C-G formula based on Cr of 0.65). No results for input(s): VANCOTROUGH, VANCOPEAK, VANCORANDOM, GENTTROUGH, GENTPEAK, GENTRANDOM, TOBRATROUGH, TOBRAPEAK, TOBRARND, AMIKACINPEAK, AMIKACINTROU, AMIKACIN in the last 72 hours.   Microbiology: Recent Results (from the past 720 hour(s))  Culture, blood (routine x 2) Call MD if unable to obtain prior to antibiotics being given     Status: None   Collection Time: 08/07/2014  8:38 PM  Result Value Ref Range Status   Specimen Description BLOOD HAND RIGHT  Final   Special Requests BOTTLES DRAWN AEROBIC AND ANAEROBIC 5CC  Final   Culture  Setup Time   Final    08/03/2014 01:11 Performed at [facility]   Culture   Final    NO GROWTH 5 DAYS Performed at [facility]   Report Status 08/09/2014 FINAL  Final  Culture, blood (routine x 2) Call MD if unable to obtain prior to antibiotics being given     Status: None   Collection Time: 08/09/2014  8:47 PM  Result Value Ref Range Status   Specimen Description BLOOD HAND LEFT  Final   Special Requests BOTTLES DRAWN AEROBIC AND ANAEROBIC 4CC  Final   Culture  Setup Time    Final    08/03/2014 01:11 Performed at [facility]   Final    NO GROWTH 5 DAYS Performed at [facility]   Report Status 08/09/2014 FINAL  Final  MRSA PCR Screening     Status: Abnormal   Collection Time: 08/10/2014 10:57 PM  Result Value Ref Range Status   MRSA by PCR POSITIVE (A) NEGATIVE Final    Comment:        The GeneXpert MRSA Assay (FDA approved for NASAL specimens only), is one component of a comprehensive MRSA colonization surveillance program. It is not intended to diagnose MRSA infection nor to guide or monitor treatment for MRSA infections. RESULT CALLED TO, READ BACK BY AND VERIFIED WITH: HANCOCK,M RN [phone] AGT 0106 SKEEN,P  Respiratory virus panel     Status: None   Collection Time: 08/03/14  2:06 PM  Result Value Ref Range Status   Source - RVPAN NASAL WASHINGS  Corrected    Comment: CORRECTED ON 10/26 AT 1478: PREVIOUSLY REPORTED AS NASAL WASHINGS   Respiratory Syncytial Virus A NOT DETECTED  Final   Respiratory Syncytial Virus B NOT DETECTED  Final   Influenza A NOT DETECTED  Final   Influenza B NOT DETECTED  Final   Parainfluenza 1 NOT DETECTED  Final   Parainfluenza 2 NOT DETECTED  Final   Parainfluenza 3 NOT DETECTED  Final   Metapneumovirus NOT DETECTED  Final   Rhinovirus NOT DETECTED  Final   Adenovirus  NOT DETECTED  Final   Influenza A H1 NOT DETECTED  Final   Influenza A H3 NOT DETECTED  Final    Comment: (NOTE)       Normal Reference Range for each Analyte: NOT DETECTED Testing performed using the Luminex xTAG Respiratory Viral Panel test kit. The analytical performance characteristics of this assay have been determined by [facility].  The modifications have not been cleared or approved by the FDA. This assay has been validated pursuant to the CLIA regulations and is used for clinical purposes. Performed at [facility], respiratory (NON-Expectorated)     Status: None   Collection Time:  08/05/14  7:52 PM  Result Value Ref Range Status   Specimen Description TRACHEAL ASPIRATE  Final   Special Requests NONE  Final   Gram Stain   Final    FEW WBC PRESENT,BOTH PMN AND MONONUCLEAR NO SQUAMOUS EPITHELIAL CELLS SEEN NO ORGANISMS SEEN Performed at [facility]   Culture   Final    NO GROWTH 2 DAYS Performed at [facility]   Report Status 08/08/2014 FINAL  Final  Culture, respiratory (NON-Expectorated)     Status: None (Preliminary result)   Collection Time: 08/15/14  4:33 PM  Result Value Ref Range Status   Specimen Description TRACHEAL ASPIRATE  Final   Special Requests NONE  Final   Gram Stain   Final    MODERATE WBC PRESENT,BOTH PMN AND MONONUCLEAR NO SQUAMOUS EPITHELIAL CELLS SEEN NO ORGANISMS SEEN Performed at [facility]    Culture   Final    Non-Pathogenic Oropharyngeal-type Flora Isolated. Performed at [facility]    Report Status PENDING  Incomplete    Anti-infectives    Start     Dose/Rate Route Frequency Ordered Stop   08/15/14 2100  vancomycin (VANCOCIN) IVPB 750 mg/150 ml premix     750 mg150 mL/hr over 60 Minutes Intravenous Every 12 hours 08/15/14 1318     08/14/14 1530  vancomycin (VANCOCIN) IVPB 750 mg/150 ml premix  Status:  Discontinued     750 mg150 mL/hr over 60 Minutes Intravenous Every 12 hours 08/14/14 1525 08/15/14 1318   08/14/14 1530  piperacillin-tazobactam (ZOSYN) IVPB 3.375 g     3.375 g12.5 mL/hr over 240 Minutes Intravenous 3 times per day 08/14/14 1525     08/03/2014 2330  cefTRIAXone (ROCEPHIN) 1 g in dextrose 5 % 50 mL IVPB     1 g100 mL/hr over 30 Minutes Intravenous Every 24 hours 08/09/2014 2253 08/08/14 2302   08/09/2014 2330  azithromycin (ZITHROMAX) 500 mg in dextrose 5 % 250 mL IVPB  Status:  Discontinued     500 mg250 mL/hr over 60 Minutes Intravenous Every 24 hours 07/27/2014 2253 08/07/14 1028      Assessment: 63 year old male on Day #5 of a planned 7 day course of Vancomycin and Zosyn for  pneumonia.  His renal function is stable.  Per CCM his antibiotics are to stop on 11/10.  Goal of Therapy:  Vancomycin trough level 15-20 mcg/ml  Plan:  Continue Vancomycin 780m IV q12h Continue Zosyn 3.375gm IV q8h extended infusion Monitor renal function closely Given limited duration will defer checking Vancomycin trough unless renal function or clinical condition deteriorates  MLegrand Como Pharm.D., BCPS, AAHIVP Clinical Pharmacist Phone: [phone] [phone]/05/2014, 1:57 PM

## 2014-08-18 NOTE — Progress Notes (Signed)
PULMONARY / CRITICAL CARE MEDICINE   Name: Kenneth Bass MRN: [id number] DOB: 12-18-1950    ADMISSION DATE:  08/10/2014 CONSULTATION DATE: 08/03/2014  REFERRING MD:  Criselda Peaches   CHIEF COMPLAINT:  Acute on chronic respiratory failure   INITIAL PRESENTATION: 63 y/o M smoker with PMH of COPD, ETOH and benzo dep anxiety. Admitted 10/23 w/ working dx CAP and AECOPD. Developed worsening resp failure and delirium/agitation on 10/24, intubated on 10/25 for sudden onset bronchospasm.   SIGNIFICANT EVENTS/STUDIES: 10/23  admitted 10/24  progressive resp failure, placed on BIPAP. Transferred to ICU  10/25  severe anxiety/ agitation. Desaturated and did not improve w/ BIPAP. Required emergent intubation.  10/27  off pressors 10/31  Sedate, intermittent agitation, fiO2 to 60% 11/01  Intermittent agitation, high dose fentanyl / propofol  11/02  D/C propofol, continued agitation on precedex, versed added 11/03  Cont agitation on max precedex/versed/fentanyl gtt 11/04  Trach, vanc/zosyn started, agitation improved. Lasix TID 11/05 improved resp status on trach. Added methadone, attempting to wean drips. Febrile 101F 11/7 increased methadone 11/8 increase ativan  SUBJECTIVE/OVERNIGHT EVENTS:  11/8 remains on 50 mcg of diprivan up from 30 on 11/7  VITAL SIGNS: Temp:  [98.7 F (37.1 C)-100.5 F (38.1 C)] 99.1 F (37.3 C) (11/08 0745) Pulse Rate:  [40-108] 105 (11/08 0747) Resp:  [13-25] 25 (11/08 0747) BP: (72-180)/(36-87) 110/52 mmHg (11/08 0747) SpO2:  [85 %-94 %] 91 % (11/08 0747) FiO2 (%):  [50 %-60 %] 60 % (11/08 0800)  HEMODYNAMICS:    VENTILATOR SETTINGS: Vent Mode:  [-] PRVC FiO2 (%):  [50 %-60 %] 60 % Set Rate:  [22 bmp] 22 bmp Vt Set:  [500 mL] 500 mL PEEP:  [5 cmH20-8 cmH20] 5 cmH20 Plateau Pressure:  [14 cmH20-18 cmH20] 14 cmH20  INTAKE / OUTPUT:  Intake/Output Summary (Last 24 hours) at 08/18/14 0856 Last data filed at 08/18/14 0800  Gross per 24 hour  Intake  2790.69 ml  Output   2825 ml  Net -34.31 ml    PHYSICAL EXAMINATION: General: Thin chronically ill-appearing male, appears older than stated age.  Neuro: Sedated on vent, RASS +1. agitated or sedated HEENT: Scattered abrasions on scalp, trach cdi Cardiovascular: RRR, normal s1/s2, no murmur or JVD Lungs: Diminshed air movement, still diminished bilat.  Abdomen:  Soft, thin, nontender, + bowel sounds. Musculoskeletal:  Intact. No LE edema. Skin: Ecchymoses UE and LE  LABS: PULMONARY  Recent Labs Lab 08/13/14 0324  PHART 7.419  PCO2ART 65.2*  PO2ART 54.5*  HCO3 41.4*  TCO2 43.4  O2SAT 88.1    CBC  Recent Labs Lab 08/16/14 0456 08/17/14 0420 08/18/14 0441  HGB 11.9* 10.4* 10.1*  HCT 36.6* 33.3* 32.6*  WBC 16.1* 13.5* 9.8  PLT 254 230 211    COAGULATION  Recent Labs Lab 08/13/14 0336 08/14/14 0350  INR 1.02 1.11    CARDIAC  No results for input(s): TROPONINI in the last 168 hours.  Recent Labs Lab 08/13/14 0336  PROBNP 9178.0*     CHEMISTRY  Recent Labs Lab 08/13/14 0336 08/14/14 0350 08/14/14 1205 08/15/14 0500 08/16/14 0456 08/17/14 0420 08/17/14 2000 08/18/14 0441  NA 142  --  146 149* 148* 143  --  150*  K 3.7  --  3.9 4.1 4.7 3.5*  --  4.2  CL 93*  --  101 102 101 99  --  105  CO2 39*  --  36* 38* 37* 36*  --  38*  GLUCOSE 114*  --  115*  141* 109* 120*  --  124*  BUN 42*  --  45* 54* 45* 41*  --  51*  CREATININE 0.49*  --  0.49* 0.60 0.57 0.58  --  0.65  CALCIUM 8.9  --  8.4 8.5 9.0 8.3*  --  8.7  MG 2.0 1.8  --  1.8  --  1.4* 2.6* 2.3  PHOS 3.1 3.2  --  3.7  --  3.4  --  3.8   Estimated Creatinine Clearance: 69.7 mL/min (by C-G formula based on Cr of 0.65).   LIVER  Recent Labs Lab 08/13/14 0336 08/14/14 0350 08/16/14 0500  ALBUMIN  --   --  2.5*  INR 1.02 1.11  --     INFECTIOUS  Recent Labs Lab 08/13/14 0045 08/18/14 0441  LATICACIDVEN 0.8 0.6    ENDOCRINE CBG (last 3)   Recent Labs  08/18/14 0016  08/18/14 0408 08/18/14 0737  GLUCAP 115* 113* 141*    IMAGING x48h Dg Chest Port 1 View  08/17/2014   CLINICAL DATA:  63 year old male with COPD currently admitted for respiratory failure. A subsequent encounter.  EXAM: PORTABLE CHEST - 1 VIEW  COMPARISON:  Multiple recent prior chest x-rays most recently 08/16/2014  FINDINGS: Stable position of tracheostomy tube. Tip is midline and at the level of the clavicles. A weighted tip enteric feeding tube is present. The tip of the tube overlies the gastric fundus. Right IJ central venous catheter with the tip overlying the distal SVC. Cardiac and mediastinal contours are unchanged. Persistent bilateral layering pleural effusions and bibasilar atelectasis. Perhaps slightly increased crest that perhaps slightly improved aeration in the right base. Persistent irregular airspace disease in the left mid and lower lung. Background changes of severe emphysema. No pneumothorax. No new acute abnormality.  IMPRESSION: 1. Perhaps slightly improved aeration in the right lung base. Otherwise, no significant interval change in the appearance of the chest. 2. The tip of the enteric feeding tube overlies the gastric fundus. If transpyloric feeding is clinically desired, recommend advancing the tube. 3. Other support apparatus remain in satisfactory position.   Electronically Signed   By: Malachy MoanHeath  McCullough M.D.   On: 08/17/2014 07:33    ASSESSMENT / PLAN:  NEUROLOGIC A:   Acute encephalopathy - failed precedex gtt Panic attack DTs  Hx seizure disorder, on dilantin at therapeutic goal 11/1 after correction  -08/17/14 still attempting weaning drips, increase methadone -11/8 goal is to get off diprivan. Increase ativan 11/8  P:   RASS goal: 0 to -2 Propofol gtt restarted 11/4 (previously on from 10/25 to 11/2), wean may need restraints. Fentanyl gtt, wean Methadone 20mg  BID, hold QTc >54300ms Ativan 3mg  TID  Seroquel 200 TID Phenytoin 100mg  IV TID Cont thiamine,  folate, MVI Monitor for DT's  PULMONARY OETT 10/25>>> 11/04 Trach 11/04 A: Acute hypoxic and hypercarbic respiratory failure PNA  AE-COPD - likely severe COPD  -08/16/14 stable on trach  P:   Wean Fio2 as able Duoneb Q6 with Q3 PRN albuterol  Steroids 40 mg Q12, decreased to qd 11/7 Will need O2 assessment and decision regarding home regimen upon discharge (on no meds PTA) Sedate as needed   CARDIOVASCULAR CVL 10/25 >> A-line 10/25 >> 10/29 A:  Sepsis - resolved off pressors Echo 08/03/14 EF 55-60%    P:  Tele monitoring PRN EKG with seroquel  RENAL A:  Hypokalemia/ hypomag - resolved Hypochloremia - resolved Hypernatremia Contraction alkalosis   -08/17/14 hypernatremia resolved, bicarb stable/decreasing. Maintaining good diuresis.  P:  Follow BMP Replete electrolytes as indicated Lasix 20mg  q8hrs, decreased to daily on 11/8, note bun rising Free h20 started 11/8  GASTROINTESTINAL OGT >> A:   Aspiration risk  Protein calorie malnutrition  -08/16/14 last BM reported on 11/3 P:   PPI  Cont TFs @ 55cc/hr, at goal Colace QD, hold for diarrhea   HEMATOLOGIC A:   Mild thrombocytopenia, resolved  -08/17/14 plts continue to improve. Hgb stable. Resolved  P:  Trend CBC  Monitor signs of bleeding [place] heparin   INFECTIOUS +MRSA by PCR screen BCx2 10/23>>>neg resp viral panel 10/24>>>Negative Abx: azithro 10/23>>>10/28 Abx: rocephin 10/23>>> 10/30 Abx: Zosyn 11/04>>> Abx: Vancomycin 11/04>>> A:   CAP  -08/16/14 neutrophil predominant leukocytosis (WBC 9.4>>16.1), some attributed to volume reduction P:   Contact isolation per protocol Vancomycin stop date 11/10 Zosyn stop date 11/10  ENDOCRINE A:   Stable CBGs - stress and steroids causing some hyperglycemia  P:   SSI at goal < 180 Solumedrol 40mg  q12   FAMILY  Updates: wife & daughter-in-law at bedside 10/29, 10/31 by RB, 08/12/14 & 08/13/14 by MR. 08/14/14 by DF. 08/15/14 by RA , 11/7 per  MR  Inter-disciplinary family meet or Palliative Care meeting:  Discussed with family regarding goals of care. Concerned he will have difficulty with weaning from mechanical vent and long term prognosis with severe COPD & ETOH abuse hx. Family aware of long term care needed.  Pursue LTAC options in future.  Brett CanalesSteve Minor ACNP [facility] Pager [phone] till 3 pm If no answer page [phone] 08/18/2014, 8:56 AM

## 2014-08-19 ENCOUNTER — Encounter (HOSPITAL_COMMUNITY): Payer: Self-pay | Admitting: Radiology

## 2014-08-19 ENCOUNTER — Inpatient Hospital Stay (HOSPITAL_COMMUNITY): Payer: Medicare Other

## 2014-08-19 DIAGNOSIS — E40 Kwashiorkor: Secondary | ICD-10-CM

## 2014-08-19 LAB — BASIC METABOLIC PANEL
Anion gap: 8 (ref 5–15)
BUN: 54 mg/dL — AB (ref 6–23)
CHLORIDE: 104 meq/L (ref 96–112)
CO2: 34 mEq/L — ABNORMAL HIGH (ref 19–32)
Calcium: 8.5 mg/dL (ref 8.4–10.5)
Creatinine, Ser: 0.65 mg/dL (ref 0.50–1.35)
GFR calc non Af Amer: >90 mL/min (ref >90)
Glucose, Bld: 117 mg/dL — ABNORMAL HIGH (ref 70–99)
POTASSIUM: 4.3 meq/L (ref 3.7–5.3)
Sodium: 146 mEq/L (ref 137–147)

## 2014-08-19 LAB — POCT I-STAT 3, ART BLOOD GAS (G3+)
ACID-BASE EXCESS: 8 mmol/L — AB (ref 0.0–2.0)
BICARBONATE: 35.5 meq/L — AB (ref 20.0–24.0)
O2 Saturation: 85 %
PCO2 ART: 63.1 mmHg — AB (ref 35.0–45.0)
PO2 ART: 55 mmHg — AB (ref 80.0–100.0)
Patient temperature: 98.8
TCO2: 37 mmol/L (ref 0–100)
pH, Arterial: 7.359 (ref 7.350–7.450)

## 2014-08-19 LAB — CULTURE, RESPIRATORY

## 2014-08-19 LAB — CBC
HCT: 32.3 % — ABNORMAL LOW (ref 39.0–52.0)
HEMOGLOBIN: 10 g/dL — AB (ref 13.0–17.0)
MCH: 32.8 pg (ref 26.0–34.0)
MCHC: 31 g/dL (ref 30.0–36.0)
MCV: 105.9 fL — ABNORMAL HIGH (ref 78.0–100.0)
Platelets: 221 10*3/uL (ref 150–400)
RBC: 3.05 MIL/uL — ABNORMAL LOW (ref 4.22–5.81)
RDW: 16.6 % — AB (ref 11.5–15.5)
WBC: 10.7 10*3/uL — ABNORMAL HIGH (ref 4.0–10.5)

## 2014-08-19 LAB — GLUCOSE, CAPILLARY
GLUCOSE-CAPILLARY: 120 mg/dL — AB (ref 70–99)
GLUCOSE-CAPILLARY: 158 mg/dL — AB (ref 70–99)
GLUCOSE-CAPILLARY: 126 mg/dL — AB (ref 70–99)
Glucose-Capillary: 112 mg/dL — ABNORMAL HIGH (ref 70–99)
Glucose-Capillary: 120 mg/dL — ABNORMAL HIGH (ref 70–99)
Glucose-Capillary: 157 mg/dL — ABNORMAL HIGH (ref 70–99)

## 2014-08-19 LAB — CULTURE, RESPIRATORY W GRAM STAIN

## 2014-08-19 LAB — MAGNESIUM: MAGNESIUM: 2 mg/dL (ref 1.5–2.5)

## 2014-08-19 LAB — PHOSPHORUS: Phosphorus: 3.4 mg/dL (ref 2.3–4.6)

## 2014-08-19 MED ORDER — METHADONE HCL 10 MG PO TABS
20.0000 mg | ORAL_TABLET | Freq: Two times a day (BID) | ORAL | Status: DC
  Administered 2014-08-19 – 2014-08-21 (×4): 20 mg
  Filled 2014-08-19 (×4): qty 2

## 2014-08-19 MED ORDER — PREDNISONE 10 MG PO TABS
10.0000 mg | ORAL_TABLET | Freq: Every day | ORAL | Status: DC
  Filled 2014-08-19: qty 1

## 2014-08-19 MED ORDER — VALPROATE SODIUM 500 MG/5ML IV SOLN
250.0000 mg | Freq: Two times a day (BID) | INTRAVENOUS | Status: DC
  Administered 2014-08-19 (×2): 250 mg via INTRAVENOUS
  Filled 2014-08-19 (×4): qty 2.5

## 2014-08-19 MED ORDER — ARFORMOTEROL TARTRATE 15 MCG/2ML IN NEBU
15.0000 ug | INHALATION_SOLUTION | Freq: Two times a day (BID) | RESPIRATORY_TRACT | Status: DC
  Administered 2014-08-19 – 2014-08-21 (×4): 15 ug via RESPIRATORY_TRACT
  Filled 2014-08-19 (×7): qty 2

## 2014-08-19 MED ORDER — METHADONE HCL 10 MG PO TABS
10.0000 mg | ORAL_TABLET | Freq: Once | ORAL | Status: AC
  Administered 2014-08-19: 10 mg
  Filled 2014-08-19: qty 1

## 2014-08-19 MED ORDER — METHYLPREDNISOLONE SODIUM SUCC 40 MG IJ SOLR
20.0000 mg | Freq: Every day | INTRAMUSCULAR | Status: AC
  Administered 2014-08-20: 20 mg via INTRAVENOUS
  Filled 2014-08-19: qty 0.5

## 2014-08-19 MED ORDER — BUDESONIDE 0.25 MG/2ML IN SUSP
0.2500 mg | Freq: Two times a day (BID) | RESPIRATORY_TRACT | Status: DC
  Administered 2014-08-19 – 2014-08-21 (×4): 0.25 mg via RESPIRATORY_TRACT
  Filled 2014-08-19 (×7): qty 2

## 2014-08-19 MED ORDER — PREDNISONE 5 MG PO TABS: 5.0000 mg | ORAL_TABLET | Freq: Every day | ORAL | Status: DC

## 2014-08-19 MED ORDER — PREDNISONE 10 MG PO TABS
15.0000 mg | ORAL_TABLET | Freq: Every day | ORAL | Status: AC
  Administered 2014-08-21: 15 mg via ORAL
  Filled 2014-08-19: qty 1

## 2014-08-19 MED ORDER — FLEET ENEMA 7-19 GM/118ML RE ENEM
1.0000 | ENEMA | Freq: Once | RECTAL | Status: AC
  Administered 2014-08-19: 1 via RECTAL
  Filled 2014-08-19: qty 1

## 2014-08-19 NOTE — Progress Notes (Signed)
PULMONARY / CRITICAL CARE MEDICINE   Name: Kenneth GullyFrancis J Bass MRN: [id number] DOB: 1951-06-27    ADMISSION DATE:  06-30-14 CONSULTATION DATE: 08/03/2014  REFERRING MD:  Criselda PeachesMullen   CHIEF COMPLAINT:  Acute on chronic respiratory failure   INITIAL PRESENTATION: 63 y/o M smoker with PMH of COPD, ETOH and benzo dep anxiety. Admitted 10/23 w/ working dx CAP and AECOPD. Developed worsening resp failure and delirium/agitation on 10/24, intubated on 10/25 for sudden onset bronchospasm.   SIGNIFICANT EVENTS/STUDIES: 10/23  admitted 10/24  progressive resp failure, placed on BIPAP. Transferred to ICU  10/25  severe anxiety/ agitation. Desaturated and did not improve w/ BIPAP. Required emergent intubation.  10/27  off pressors 10/31  Sedate, intermittent agitation, fiO2 to 60% 11/01  Intermittent agitation, high dose fentanyl / propofol  11/02  D/C propofol, continued agitation on precedex, versed added 11/03  Cont agitation on max precedex/versed/fentanyl gtt 11/04  Trach, vanc/zosyn started, agitation improved. Lasix TID 11/05 improved resp status on trach. Added methadone, attempting to wean drips. Febrile 101F 11/07 increased methadone 11/08 increase ativan  SUBJECTIVE/OVERNIGHT EVENTS:  11/09 RN reports issues with propofol weaning, pt will become agitated and desat req increase in FiO2, having a lot of thick secretions from trach.  VITAL SIGNS: Temp:  [98.3 F (36.8 C)-100.1 F (37.8 C)] 99.7 F (37.6 C) (11/09 0805) Pulse Rate:  [88-114] 104 (11/09 0726) Resp:  [15-25] 24 (11/09 0726) BP: (89-148)/(43-86) 103/59 mmHg (11/09 0726) SpO2:  [83 %-98 %] 94 % (11/09 0726) FiO2 (%):  [50 %-70 %] 70 % (11/09 0726) Weight:  [55.1 kg (121 lb 7.6 oz)] 55.1 kg (121 lb 7.6 oz) (11/09 0356)  HEMODYNAMICS:    VENTILATOR SETTINGS: Vent Mode:  [-] PRVC FiO2 (%):  [50 %-70 %] 70 % Set Rate:  [22 bmp] 22 bmp Vt Set:  [500 mL] 500 mL PEEP:  [5 cmH20] 5 cmH20 Plateau Pressure:  [2 cmH20-18  cmH20] 18 cmH20  INTAKE / OUTPUT:  Intake/Output Summary (Last 24 hours) at 08/19/14 [id number] Last data filed at 08/19/14 0600  Gross per 24 hour  Intake 2936.72 ml  Output   1650 ml  Net 1286.72 ml    PHYSICAL EXAMINATION: General: Thin chronically ill-appearing male, appears older than stated age.  Neuro: Sedated on vent, RASS -1.  HEENT: Scattered abrasions on scalp, trach cdi Cardiovascular: RRR, normal s1/s2, no murmur or JVD Lungs: Diminshed air movement bilaterally.  Abdomen:  Thin, mildly distended, no BS. Musculoskeletal:  Intact. No LE edema. Skin: Ecchymoses UE and LE  LABS: PULMONARY  Recent Labs Lab 08/13/14 0324  PHART 7.419  PCO2ART 65.2*  PO2ART 54.5*  HCO3 41.4*  TCO2 43.4  O2SAT 88.1    CBC  Recent Labs Lab 08/17/14 0420 08/18/14 0441 08/19/14 0430  HGB 10.4* 10.1* 10.0*  HCT 33.3* 32.6* 32.3*  WBC 13.5* 9.8 10.7*  PLT 230 211 221    COAGULATION  Recent Labs Lab 08/13/14 0336 08/14/14 0350  INR 1.02 1.11    CARDIAC  No results for input(s): TROPONINI in the last 168 hours.  Recent Labs Lab 08/13/14 0336  PROBNP 9178.0*     CHEMISTRY  Recent Labs Lab 08/14/14 0350  08/15/14 0500 08/16/14 0456 08/17/14 0420 08/17/14 2000 08/18/14 0441 08/19/14 0430  NA  --   < > 149* 148* 143  --  150* 146  K  --   < > 4.1 4.7 3.5*  --  4.2 4.3  CL  --   < > 102  101 99  --  105 104  CO2  --   < > 38* 37* 36*  --  38* 34*  GLUCOSE  --   < > 141* 109* 120*  --  124* 117*  BUN  --   < > 54* 45* 41*  --  51* 54*  CREATININE  --   < > 0.60 0.57 0.58  --  0.65 0.65  CALCIUM  --   < > 8.5 9.0 8.3*  --  8.7 8.5  MG 1.8  --  1.8  --  1.4* 2.6* 2.3 2.0  PHOS 3.2  --  3.7  --  3.4  --  3.8 3.4  < > = values in this interval not displayed. Estimated Creatinine Clearance: 71.7 mL/min (by C-G formula based on Cr of 0.65).   LIVER  Recent Labs Lab 08/13/14 0336 08/14/14 0350 08/16/14 0500  ALBUMIN  --   --  2.5*  INR 1.02 1.11  --      INFECTIOUS  Recent Labs Lab 08/13/14 0045 08/18/14 0441  LATICACIDVEN 0.8 0.6    ENDOCRINE CBG (last 3)   Recent Labs  08/18/14 1922 08/19/14 0003 08/19/14 0342  GLUCAP 133* 112* 120*    IMAGING x48h Dg Chest Port 1 View  08/19/2014   CLINICAL DATA:  Respiratory failure.  EXAM: PORTABLE CHEST - 1 VIEW  COMPARISON:  08/17/2014  FINDINGS: Right PICC line tip overlies the level of the superior vena cava. Feeding tube is in place, tip off the film above the on the gastroesophageal junction. The patient has a tracheostomy tube, unchanged in position.  Heart size appears normal. There are patchy infiltrates in lungs bilaterally, associated with bibasilar pleural effusions. The lung bases are excluded from the film.  IMPRESSION: There is probably little change in bilateral lower lobe infiltrates and bilateral pleural effusions.   Electronically Signed   By: Rosalie GumsBeth  Brown M.D.   On: 08/19/2014 07:09    ASSESSMENT / PLAN:  NEUROLOGIC A:   Acute encephalopathy - failed precedex gtt Panic attack DTs  Hx seizure disorder, on dilantin at therapeutic goal 11/1 after correction  -08/19/14 difficulty with weaning propofol  P:   RASS goal: 0 to -2 Propofol gtt restarted 11/4 (previously on from 10/25 to 11/2), wean to d/c as primary goal. Fentanyl gtt, wean Methadone 20mg  BID, hold QTc >55800ms Ativan 3mg  TID  Seroquel 200 TID Start valproate 250mg  BID Cont thiamine, folate, MVI Monitor for DT's  PULMONARY OETT 10/25>>> 11/04 Trach 11/04 A: Acute hypoxic and hypercarbic respiratory failure PNA  AE-COPD - likely severe COPD  -08/19/14 increased O2 demand when weaning propofol, resp secretions. Did better with PS/CPAP this AM.  P:   Wean Fio2 as able Duoneb Q6 with Q3 PRN albuterol  Solumedrol decrease to 20mg  qday, switch to prednisone tomorrow Start brovana and pulmicort  CARDIOVASCULAR CVL 10/25 >> A-line 10/25 >> 10/29 A:  Sepsis - resolved off pressors Echo  08/03/14 EF 55-60%  -08/19/14 stable  P:  Tele monitoring PRN EKG with seroquel  RENAL A:  Hypokalemia/ hypomag - resolved Hypochloremia - resolved Hypernatremia, improved Contraction alkalosis   -08/19/14 stable, hypernatremia improved while holding lasix and free water  P:   Follow BMP Replete electrolytes as indicated Hold lasix Free h20 started 11/8  GASTROINTESTINAL OGT >> A:   Aspiration risk  Protein calorie malnutrition  -08/19/14 distended, multiple days since BM  P:   PPI  Cont TFs @ 55cc/hr, at goal Colace, senna qday  Fleet Enema  HEMATOLOGIC A:   Mild thrombocytopenia, resolved  -08/19/14 stable  P:  Trend CBC  Monitor signs of bleeding [place] heparin   INFECTIOUS BCx2 10/23>>>neg resp viral panel 10/24>>>Negative Abx: azithro 10/23>>>10/28 Abx: rocephin 10/23>>> 10/30 Abx: Zosyn 11/04>>> Abx: Vancomycin 11/04>>> A:   CAP  -08/19/14 stable, WBC trending down  P:   Contact isolation per protocol Vancomycin stop date 11/10 Zosyn stop date 11/10  ENDOCRINE A:   Stable CBGs - stress and steroids causing some hyperglycemia  P:   SSI at goal < 180 Weaning steroids   FAMILY  Updates: wife & daughter-in-law at bedside 10/29, 10/31 by RB, 08/12/14 & 08/13/14 by MR. 08/14/14 by DF. 08/15/14 by RA , 11/7 per MR. 11/9 by BQ  Inter-disciplinary family meet or Palliative Care meeting:  Discussed with family regarding goals of care. Concerned he will have difficulty with weaning from mechanical vent and long term prognosis with severe COPD & ETOH abuse hx. Family aware of long term care needed.  Pursue LTAC options in future.  Tawni Carnes, MD 08/19/2014, 8:12 AM PGY-2, [facility]  Attending;  I have seen and examined the patient with nurse practitioner/resident and agree with the note above.   On my exam he was air trapping significantly on the vent, very dyssynchronous Will try to minimize solumedrol and ativan this week Add  divalproate Start PSV trials, use pressure control in between for comfort  Family updated at bedside by me.  CC time 45 minutes  Heber Gooding, MD [place] [facility] Pager: [phone] Cell: [phone] If no response, call [phone]

## 2014-08-19 NOTE — Progress Notes (Signed)
Placed on full support for transport.  Transported to CT without any complications.  Left on 100% due to desaturation.  RT will continue to monitor.

## 2014-08-19 NOTE — Progress Notes (Signed)
UR Completed.  Zymire Turnbo [name] [phone] 10/29/2013  

## 2014-08-19 NOTE — Progress Notes (Signed)
NUTRITION FOLLOW UP  Intervention:    Continue Vital High Protein at 55 ml/h (1320 ml per day) to provide 1320 kcals, 116 gm protein, 1104 ml free water daily.  Propofol providing additional 359 kcal.  TF regimen with Propofol will provide 1679 kcal and 116 gm protein (108% of calorie needs and 100% of protein needs).   As Propofol is weaned, total calorie intake will decline, if propofol is able to be totally stopped, will need to change TF to Vital AF 1.2 at 55 ml/h (1320 ml per day) to provide 1584 kcals, 99 gm protein, 1071 ml free water daily.  Nutrition Dx:   Inadequate oral intake related to inability to eat as evidenced by NPO status; ongoing  Goal:   Pt to meet >/= 90% of their estimated nutrition needs; met  Monitor:   TF tolerance/adequacy, weight trend, labs, vent status.  Assessment:   18 yom smoker with PMH of COPD, ETOH and benzo dep anxiety. Admitted 10/23 w/dx CAP and AECOPD.   Patient developed worsening resp failure and delirium/agitation. Transferred to MICU from 3S-Stepdown 10/24. Required emergent intubation 10/25. S/P bronchoscopy and percutaneous tracheostomy on 11/4.  Patient remains intubated on ventilator support MV: 9.5 L/min Temp (24hrs), Avg:99.3 F (37.4 C), Min:98.3 F (36.8 C), Max:100.1 F (37.8 C)  Propofol: 13.6 ml/hr providing 359 kcals/day. Per discussion in ICU rounds, plans to wean and hopefully discontinue Propofol today.   Patient has small bore NG tube in place with tip in the region of the gastric body. Vital High Protein is infusing @ 55 ml/hr (1320 ml per day) providing 1320 kcals, 116 gm protein, 1104 ml free water daily.   Residuals: none per RN  Last BM: 11/9   Height: Ht Readings from Last 1 Encounters:  08/03/14 '5\' 6"'  (1.676 m)    Weight Status:   Wt Readings from Last 1 Encounters:  08/19/14 121 lb 7.6 oz (55.1 kg)   08/12/14 125 lb 14.1 oz (57.1 kg)   08/05/14 123 lb 3.8 oz (55.9 kg)    Body mass index is  19.62 kg/(m^2).   Re-estimated needs:  Kcal: 1546 Protein: 90-110 gm Fluid: 1.6 L  Skin: intact  Diet Order:  NPO   Intake/Output Summary (Last 24 hours) at 08/19/14 1156 Last data filed at 08/19/14 1000  Gross per 24 hour  Intake 2715.15 ml  Output   1625 ml  Net 1090.15 ml   Labs:   Recent Labs Lab 08/17/14 0420 08/17/14 2000 08/18/14 0441 08/19/14 0430  NA 143  --  150* 146  K 3.5*  --  4.2 4.3  CL 99  --  105 104  CO2 36*  --  38* 34*  BUN 41*  --  51* 54*  CREATININE 0.58  --  0.65 0.65  CALCIUM 8.3*  --  8.7 8.5  MG 1.4* 2.6* 2.3 2.0  PHOS 3.4  --  3.8 3.4  GLUCOSE 120*  --  124* 117*    CBG (last 3)   Recent Labs  08/19/14 0003 08/19/14 0342 08/19/14 0804  GLUCAP 112* 120* 120*    Scheduled Meds: . antiseptic oral rinse  7 mL Mouth Rinse QID  . arformoterol  15 mcg Nebulization BID  . aspirin  325 mg Per Tube Daily  . budesonide (PULMICORT) nebulizer solution  0.25 mg Nebulization BID  . chlorhexidine  15 mL Mouth Rinse BID  . docusate  100 mg Oral Daily  . etomidate  40 mg Intravenous Once  .  feeding supplement (VITAL HIGH PROTEIN)  1,000 mL Per Tube Q24H  . folic acid  1 mg Per Tube Daily  . free water  100 mL Per Tube 3 times per day  . heparin  5,000 Units Subcutaneous 3 times per day  . insulin aspart  0-9 Units Subcutaneous 6 times per day  . ipratropium-albuterol  3 mL Nebulization Q6H  . LORazepam  1 mg Oral Once  . LORazepam  3 mg Per Tube TID  . methadone  10 mg Per Tube Once  . methadone  20 mg Per Tube Q12H  . methylPREDNISolone (SOLU-MEDROL) injection  40 mg Intravenous Daily  . multivitamin  5 mL Oral Daily  . pantoprazole sodium  40 mg Per Tube Daily  . piperacillin-tazobactam (ZOSYN)  IV  3.375 g Intravenous 3 times per day  . potassium chloride  40 mEq Per Tube BID  . QUEtiapine  200 mg Per Tube 3 times per day  . senna  1 tablet Per Tube Daily  . sodium chloride  10 mL Intravenous Q12H  . sodium phosphate  1 enema  Rectal Once  . thiamine  100 mg Per Tube Daily  . valproate sodium  250 mg Intravenous Q12H  . vancomycin  750 mg Intravenous Q12H    Continuous Infusions: . sodium chloride 10 mL/hr at 08/14/14 2000  . fentaNYL infusion INTRAVENOUS 250 mcg/hr (08/19/14 0425)  . midazolam (VERSED) infusion Stopped (08/15/14 0724)  . propofol 35 mcg/kg/min (08/19/14 0840)    Molli Barrows, RD, LDN, [place] Pager [phone] After Hours Pager [phone]

## 2014-08-20 ENCOUNTER — Inpatient Hospital Stay (HOSPITAL_COMMUNITY): Payer: Medicare Other

## 2014-08-20 LAB — GLUCOSE, CAPILLARY
GLUCOSE-CAPILLARY: 116 mg/dL — AB (ref 70–99)
GLUCOSE-CAPILLARY: 158 mg/dL — AB (ref 70–99)
Glucose-Capillary: 113 mg/dL — ABNORMAL HIGH (ref 70–99)
Glucose-Capillary: 135 mg/dL — ABNORMAL HIGH (ref 70–99)
Glucose-Capillary: 116 mg/dL — ABNORMAL HIGH (ref 70–99)
Glucose-Capillary: 127 mg/dL — ABNORMAL HIGH (ref 70–99)

## 2014-08-20 LAB — TRIGLYCERIDES: TRIGLYCERIDES: 103 mg/dL (ref <150)

## 2014-08-20 MED ORDER — VALPROATE SODIUM 500 MG/5ML IV SOLN
500.0000 mg | Freq: Two times a day (BID) | INTRAVENOUS | Status: DC
  Administered 2014-08-20 – 2014-08-21 (×3): 500 mg via INTRAVENOUS
  Filled 2014-08-20 (×4): qty 5

## 2014-08-20 MED ORDER — LORAZEPAM 1 MG PO TABS
3.0000 mg | ORAL_TABLET | Freq: Three times a day (TID) | ORAL | Status: DC
  Administered 2014-08-20 (×3): 3 mg
  Filled 2014-08-20 (×3): qty 3

## 2014-08-20 MED ORDER — FUROSEMIDE 10 MG/ML IJ SOLN
20.0000 mg | Freq: Three times a day (TID) | INTRAMUSCULAR | Status: DC
  Administered 2014-08-20 – 2014-08-21 (×4): 20 mg via INTRAVENOUS
  Filled 2014-08-20 (×5): qty 2

## 2014-08-20 MED ORDER — PIPERACILLIN-TAZOBACTAM 3.375 G IVPB
3.3750 g | Freq: Three times a day (TID) | INTRAVENOUS | Status: AC
  Administered 2014-08-20: 3.375 g via INTRAVENOUS
  Filled 2014-08-20: qty 50

## 2014-08-20 MED ORDER — LORAZEPAM 1 MG PO TABS: 1.5000 mg | ORAL_TABLET | Freq: Three times a day (TID) | ORAL | Status: DC

## 2014-08-20 MED ORDER — FLEET ENEMA 7-19 GM/118ML RE ENEM
1.0000 | ENEMA | Freq: Once | RECTAL | Status: AC
  Administered 2014-08-20: 1 via RECTAL
  Filled 2014-08-20: qty 1

## 2014-08-20 NOTE — Progress Notes (Signed)
Trach changed from #6 cuffed Shiley to #6 cuffed Shiley XLT without any complications per MD.  Bilateral breath sounds noted.  Chest xray pending.  Positive color change.  RT will continue to monitor.

## 2014-08-20 NOTE — Progress Notes (Signed)
Changed patient ventilator without any complications.  RT will continue to monitor.

## 2014-08-20 NOTE — Procedures (Signed)
[facility]  Tracheostomy changed because cuff not holding air  Neck extended, sedated with propofol, high vent support  #6 trach XLT size changed over tube exchanger  No complications  Heber CarolinaBrent Kyannah Climer, MD [place] [facility] Pager: [phone] Cell: [phone] If no response, call [phone]

## 2014-08-20 NOTE — Progress Notes (Addendum)
PULMONARY / CRITICAL CARE MEDICINE   Name: Avon GullyFrancis J Turgeon MRN: [id number] DOB: Nov 11, 1950    ADMISSION DATE:  07/12/2014 CONSULTATION DATE: 08/03/2014  REFERRING MD:  Criselda PeachesMullen   CHIEF COMPLAINT:  Acute on chronic respiratory failure   INITIAL PRESENTATION: 63 y/o M smoker with PMH of COPD, ETOH and benzo dep anxiety. Admitted 10/23 w/ working dx CAP and AECOPD. Developed worsening resp failure and delirium/agitation on 10/24, intubated on 10/25 for sudden onset bronchospasm.   SIGNIFICANT EVENTS/STUDIES: 10/23  admitted 10/24  progressive resp failure, placed on BIPAP. Transferred to ICU  10/25  severe anxiety/ agitation. Desaturated and did not improve w/ BIPAP. Required emergent intubation.  10/27  off pressors 10/31  Sedate, intermittent agitation, fiO2 to 60% 11/01  Intermittent agitation, high dose fentanyl / propofol  11/02  D/C propofol, continued agitation on precedex, versed added 11/03  Cont agitation on max precedex/versed/fentanyl gtt 11/04  Trach, vanc/zosyn started, agitation improved. Lasix TID 11/05 improved resp status on trach. Added methadone, attempting to wean drips. Febrile 101F 11/08 increase ativan 11/09 Increased methadone, added valproate, stop antibiotics, weaning steroid  SUBJECTIVE/OVERNIGHT EVENTS:  11/10 RN reports about same as yesterday regarding agitation/sedation, some hypoxemia episodes and requiring 90% FiO2, balloon in trach collar broke (was requiring high pressures 60mm).   VITAL SIGNS: Temp:  [98.8 F (37.1 C)-99.7 F (37.6 C)] 99.3 F (37.4 C) (11/10 0300) Pulse Rate:  [72-118] 111 (11/10 0720) Resp:  [6-26] 15 (11/10 0700) BP: (99-166)/(47-106) 138/64 mmHg (11/10 0720) SpO2:  [87 %-96 %] 91 % (11/10 0719) FiO2 (%):  [60 %-100 %] 90 % (11/10 0720) Weight:  [54.2 kg (119 lb 7.8 oz)] 54.2 kg (119 lb 7.8 oz) (11/10 0437)  HEMODYNAMICS:    VENTILATOR SETTINGS: Vent Mode:  [-] PCV FiO2 (%):  [60 %-100 %] 90 % Set Rate:  [15 bmp]  15 bmp Vt Set:  [500 mL] 500 mL PEEP:  [5 cmH20-10 cmH20] 10 cmH20 Pressure Support:  [5 cmH20-8 cmH20] 8 cmH20 Plateau Pressure:  [13 cmH20-21 cmH20] 15 cmH20  INTAKE / OUTPUT:  Intake/Output Summary (Last 24 hours) at 08/20/14 0746 Last data filed at 08/20/14 0435  Gross per 24 hour  Intake 2813.31 ml  Output   1901 ml  Net 912.31 ml    PHYSICAL EXAMINATION: General: Thin chronically ill-appearing male, appears older than stated age.  Neuro: Sedated on vent, RASS -1.  HEENT: Scattered abrasions on scalp, trach cdi Cardiovascular: RRR, normal s1/s2, no murmur or JVD Lungs: Diminshed air movement bilaterally. Mild expiratory wheezes bilaterally. Abdomen:  Thin, mildly distended, no BS. Musculoskeletal:  Intact. No LE edema. Skin: Ecchymoses UE and LE  LABS: PULMONARY  Recent Labs Lab 08/19/14 1846  PHART 7.359  PCO2ART 63.1*  PO2ART 55.0*  HCO3 35.5*  TCO2 37  O2SAT 85.0    CBC  Recent Labs Lab 08/17/14 0420 08/18/14 0441 08/19/14 0430  HGB 10.4* 10.1* 10.0*  HCT 33.3* 32.6* 32.3*  WBC 13.5* 9.8 10.7*  PLT 230 211 221    COAGULATION  Recent Labs Lab 08/14/14 0350  INR 1.11    CARDIAC  No results for input(s): TROPONINI in the last 168 hours. No results for input(s): PROBNP in the last 168 hours.   CHEMISTRY  Recent Labs Lab 08/14/14 0350  08/15/14 0500 08/16/14 0456 08/17/14 0420 08/17/14 2000 08/18/14 0441 08/19/14 0430  NA  --   < > 149* 148* 143  --  150* 146  K  --   < > 4.1  4.7 3.5*  --  4.2 4.3  CL  --   < > 102 101 99  --  105 104  CO2  --   < > 38* 37* 36*  --  38* 34*  GLUCOSE  --   < > 141* 109* 120*  --  124* 117*  BUN  --   < > 54* 45* 41*  --  51* 54*  CREATININE  --   < > 0.60 0.57 0.58  --  0.65 0.65  CALCIUM  --   < > 8.5 9.0 8.3*  --  8.7 8.5  MG 1.8  --  1.8  --  1.4* 2.6* 2.3 2.0  PHOS 3.2  --  3.7  --  3.4  --  3.8 3.4  < > = values in this interval not displayed. Estimated Creatinine Clearance: 70.6 mL/min  (by C-G formula based on Cr of 0.65).   LIVER  Recent Labs Lab 08/14/14 0350 08/16/14 0500  ALBUMIN  --  2.5*  INR 1.11  --     INFECTIOUS  Recent Labs Lab 08/18/14 0441  LATICACIDVEN 0.6    ENDOCRINE CBG (last 3)   Recent Labs  08/19/14 1929 08/20/14 0011 08/20/14 0324  GLUCAP 126* 116* 113*    IMAGING x48h Ct Head Wo Contrast  08/19/2014   CLINICAL DATA:  Acute encephalopathy, history of seizures.  EXAM: CT HEAD WITHOUT CONTRAST  TECHNIQUE: Contiguous axial images were obtained from the base of the skull through the vertex without intravenous contrast.  COMPARISON:  07/18/2014.  FINDINGS: No evidence of an acute infarct, acute hemorrhage, mass lesion, mass effect or hydrocephalus. Atrophy. Patchy and confluent low-attenuation in the periventricular deep white matter. Scattered remote lacunar infarcts in the basal ganglia. Fluid is seen in the left maxillary sinus and right sphenoid sinus. Scattered opacification of the ethmoid air cells with complete opacification of the left sphenoid sinus. Findings are new from 08/03/2014. Bilateral mastoid effusions are new as well.  IMPRESSION: 1. No acute intracranial abnormality. 2. Atrophy, chronic microvascular white matter ischemic changes and remote lacunar infarcts. 3. Paranasal sinus fluid and mastoid effusions are new.   Electronically Signed   By: Leanna Battles M.D.   On: 08/19/2014 13:14   Dg Chest Port 1 View  08/19/2014   CLINICAL DATA:  Respiratory failure.  EXAM: PORTABLE CHEST - 1 VIEW  COMPARISON:  08/17/2014  FINDINGS: Right PICC line tip overlies the level of the superior vena cava. Feeding tube is in place, tip off the film above the on the gastroesophageal junction. The patient has a tracheostomy tube, unchanged in position.  Heart size appears normal. There are patchy infiltrates in lungs bilaterally, associated with bibasilar pleural effusions. The lung bases are excluded from the film.  IMPRESSION: There is  probably little change in bilateral lower lobe infiltrates and bilateral pleural effusions.   Electronically Signed   By: Rosalie Gums M.D.   On: 08/19/2014 07:09    ASSESSMENT / PLAN:  NEUROLOGIC A:   Acute encephalopathy - failed precedex gtt Panic attack DTs  Hx seizure disorder, on dilantin at therapeutic goal 11/1 after correction  -08/20/14 CT scan largely unremarkable  P:   RASS goal: 0 to -2 Propofol gtt restarted 11/4 (previously on from 10/25 to 11/2), stop today Fentanyl gtt, wean Methadone 20mg  BID, hold QTc >567ms Ativan 3mg  TID (decrease to 1.5mg  TID after trach replacement) Seroquel 200 TID Start valproate 250mg  BID Cont thiamine, folate, MVI Monitor for DT's  PULMONARY OETT 10/25>>> 11/04 Trach 11/04 A: Acute hypoxic and hypercarbic respiratory failure PNA, antibiotics stopped AE-COPD - likely severe COPD  -08/20/14 trach balloon ruptured, hypoxemia and requiring FiO2 90%  P:   Wean Fio2 as able Duoneb Q6 with Q3 PRN albuterol  Brovana, pulmicort Steroid taper: Solumedrol decrease to 20mg  qday, prednisone tomorrow with 15mg >10mg >5mg  PSV trials as tolerated Replace trach today   CARDIOVASCULAR CVL 10/25 >> A-line 10/25 >> 10/29 A:  Sepsis - resolved off pressors Echo 08/03/14 EF 55-60%  -08/20/14 stable. On multiple QTc prolonging agents  P:  Tele monitoring PRN EKG with multiple QTc prolonging agents Repeat 12 lead EKG today  RENAL A:  Hypokalemia/ hypomag - resolved Hypochloremia - resolved Hypernatremia, improved Contraction alkalosis   -08/20/14 stable  P:   Follow BMP Replete electrolytes as indicated Hold lasix Free h20 started 11/8  GASTROINTESTINAL OGT >> A:   Aspiration risk  Protein calorie malnutrition  -08/20/14 stable.  P:   PPI  Cont TFs @ 55cc/hr, at goal Colace, senna qday Repeat fleet enema  HEMATOLOGIC A:   Mild thrombocytopenia, resolved  -08/20/14 stable  P:  Trend CBC  Monitor signs of  bleeding [place] heparin   INFECTIOUS BCx2 10/23>>>neg resp viral panel 10/24>>>Negative Abx: azithro 10/23>>>10/28 Abx: rocephin 10/23>>> 10/30 Abx: Zosyn 11/04>>> Abx: Vancomycin 11/04>>> A:   CAP  -08/20/14 stable, afebrile.  P:   Contact isolation per protocol Vancomycin stop date 11/10 Zosyn stop date 11/10  ENDOCRINE A:   Stable CBGs - stress and steroids causing some hyperglycemia  P:   SSI at goal < 180 Weaning steroids   FAMILY  Updates: wife & daughter-in-law at bedside 10/29, 10/31 by RB, 08/12/14 & 08/13/14 by MR. 08/14/14 by DF. 08/15/14 by RA , 11/7 per MR. 11/9 by BQ  Inter-disciplinary family meet or Palliative Care meeting:  Discussed with family regarding goals of care. Concerned he will have difficulty with weaning from mechanical vent and long term prognosis with severe COPD & ETOH abuse hx. Family aware of long term care needed.  Current plan is to Pursue LTAC options in future, see discussion below  Tawni CarnesAndrew Wight, MD 08/20/2014, 7:46 AM PGY-2,  Family Medicine  Attending:  I have seen and examined the patient with nurse practitioner/resident and agree with the note above.   Mr. Rayna SextonFerguson's situation is no better today.  His  hypoxemia is worse and his encephalopathy has not improved.  I explained to his family today at length (wife, two children) that he has severe lung disease and he will need continual ventilator support.  They state that he did ask that "everything be done", but they don't think that he would want prolonged, weeks long ventilatory support.  Today they decided to make him a DNR.  I explained that we will continue to address goals of care this week.  Stop solumedrol Increase valproate Wean off propofol  Cc time by me 60 minutes  Heber CarolinaBrent Kenzy Campoverde, MD [place] [facility] Pager: [phone] Cell: [phone] If no response, call [phone]

## 2014-08-21 ENCOUNTER — Inpatient Hospital Stay (HOSPITAL_COMMUNITY): Payer: Medicare Other

## 2014-08-21 LAB — COMPREHENSIVE METABOLIC PANEL
ALBUMIN: 2.2 g/dL — AB (ref 3.5–5.2)
ALK PHOS: 61 U/L (ref 39–117)
ALT: 28 U/L (ref 0–53)
ANION GAP: 8 (ref 5–15)
AST: 30 U/L (ref 0–37)
BILIRUBIN TOTAL: 0.6 mg/dL (ref 0.3–1.2)
BUN: 49 mg/dL — ABNORMAL HIGH (ref 6–23)
CHLORIDE: 104 meq/L (ref 96–112)
CO2: 36 mEq/L — ABNORMAL HIGH (ref 19–32)
Calcium: 8.8 mg/dL (ref 8.4–10.5)
Creatinine, Ser: 0.54 mg/dL (ref 0.50–1.35)
GFR calc non Af Amer: >90 mL/min (ref >90)
GLUCOSE: 114 mg/dL — AB (ref 70–99)
POTASSIUM: 4.4 meq/L (ref 3.7–5.3)
Sodium: 148 mEq/L — ABNORMAL HIGH (ref 137–147)
Total Protein: 6.7 g/dL (ref 6.0–8.3)

## 2014-08-21 LAB — GLUCOSE, CAPILLARY
GLUCOSE-CAPILLARY: 148 mg/dL — AB (ref 70–99)
Glucose-Capillary: 114 mg/dL — ABNORMAL HIGH (ref 70–99)
Glucose-Capillary: 143 mg/dL — ABNORMAL HIGH (ref 70–99)
Glucose-Capillary: 94 mg/dL (ref 70–99)

## 2014-08-21 MED ORDER — HYDROMORPHONE HCL PF 10 MG/ML IJ SOLN
1.0000 mg/h | INTRAMUSCULAR | Status: DC
  Administered 2014-08-21: 5 mg/h via INTRAVENOUS
  Administered 2014-08-21 (×2): 2 mg/h via INTRAVENOUS
  Filled 2014-08-21 (×2): qty 2.5

## 2014-08-21 MED ORDER — HYDROMORPHONE HCL 1 MG/ML IJ SOLN: 1.0000 mg | INTRAMUSCULAR | Status: DC | PRN

## 2014-08-21 MED ORDER — LORAZEPAM 1 MG PO TABS: 1.5000 mg | ORAL_TABLET | Freq: Three times a day (TID) | ORAL | Status: DC

## 2014-08-21 MED ORDER — MIDAZOLAM HCL 2 MG/2ML IJ SOLN
2.0000 mg | INTRAMUSCULAR | Status: AC | PRN
  Administered 2014-08-21: 2 mg via INTRAVENOUS
  Filled 2014-08-21: qty 2

## 2014-08-22 NOTE — Discharge Summary (Signed)
NAME:  Kenneth Bass, Kenneth Bass            ACCOUNT NO.:  [id number]  MEDICAL RECORD NO.:  [id number]  LOCATION:  2M12C                        FACILITY:  [facility]  PHYSICIAN:  Veto KempsUGLAS BRENT MCQUAID, MDDATE OF BIRTH:  05-27-1951  DATE OF ADMISSION:  08-18-2014 DATE OF DISCHARGE:  09/03/2014                              DISCHARGE SUMMARY   DEATH NOTE:  DATE OF DEATH:  August 21, 2014.  CAUSE OF DEATH: 1. Severe community-acquired pneumonia. 2. Chronic obstructive pulmonary disease. 3. Alcoholic encephalopathy.  HISTORY OF PRESENT ILLNESS:  This is a 63 year old male, who was admitted on August 02, 2014, to [facility] for a COPD exacerbation as well as severe community-acquired pneumonia.  PAST MEDICAL HISTORY:  He has a past medical history significant for alcohol and benzo dependence with anxiety.  He was intubated on August 03, 2014, and transferred to the ICU for further management.  PAST MEDICAL HISTORY:  Please see the admission H and P.  FAMILY HISTORY:  Please see the admission H and P.  SOCIAL HISTORY:  Please see the admission H and P.  PHYSICAL EXAMINATION:  VITAL SIGNS:  On the day of death, temperature 100.4, heart rate 114, respirations 16, blood pressure 119/66, SpO2 is 87% on the ventilator. GENERAL:  Agitated on vent, appears older than stated age. NEURO:  Sedated, but intermittently agitated, opens eyes, moves all limbs spontaneously, but does not follow commands. HEENT:  Tracheostomy in place. CARDIOVASCULAR:  Regular rate and rhythm.  No murmurs, gallops or rubs. LUNGS:  Diminished breath sounds with wheezing bilaterally. ABDOMEN:  Thin, mildly distended.  No bowel sounds. MUSCULOSKELETAL:  Muscle atrophy noted. EXTREMITIES:  No edema.  HOSPITAL COURSE:  [facility].  He was intubated on August 03, 2014, for worsening severe acute respiratory failure secondary to severe  community-acquired pneumonia.  He had a prolonged stay on the ventilator and had bilateral air space disease.  A CT chest was performed.  This demonstrated remarkable emphysema bilaterally.  He was maintained on the ventilator for nearly 3 weeks total and had a tracheostomy placed during week 2 of his hospital stay.  His hospital course was complicated by very severe delirium which was treated with appropriate treatment for alcohol withdrawal as well as standard treatment for delirium in the ICU. Unfortunately, his delirium did not improve despite a very complicated medical regimen and limitation of medications which were felt to be contributing to the delirium.  During this time, his airspace disease continued to worsen and he did develop a healthcare associated pneumonia during his hospital stay.  This is associated with worsening oxygenation.  On August 20, 2014, Pulmonary and Critical Care Medicine had a lengthy discussion with the family discussing goals of care and explained that with his severe lung disease.  Further treatment would require weeks if not months of ventilatory support.  The patient's family stated that he was a dignified man and did not want this level of support.  So on August 21, 2014, they elected to withdraw care.  He died peacefully on a Dilaudid drip with his family at the bedside.          ______________________________  Veto KempsUGLAS BRENT MCQUAID, MD     DBM/MEDQ  D:  08/28/2014  T:  08/22/2014  Job:  [id number]

## 2014-09-10 NOTE — Progress Notes (Signed)
Patient asystole at 1458, no heart or lung  sounds auscultated,pupils fixed and dilated. 2nd RN verified with Clydia LlanoJames Dunlap RN, MD notified.  Corliss SkainsJuan Jammy Plotkin RN

## 2014-09-10 NOTE — Progress Notes (Addendum)
PULMONARY / CRITICAL CARE MEDICINE   Name: Kenneth GullyFrancis J Housand MRN: [id number] DOB: Nov 30, 1950    ADMISSION DATE:  08/04/2014 CONSULTATION DATE: 08/03/2014  REFERRING MD:  Criselda PeachesMullen   CHIEF COMPLAINT:  Acute on chronic respiratory failure   INITIAL PRESENTATION: 63 y/o M smoker with PMH of COPD, ETOH and benzo dep anxiety. Admitted 10/23 w/ working dx CAP and AECOPD. Developed worsening resp failure and delirium/agitation on 10/24, intubated on 10/25 for sudden onset bronchospasm.   SIGNIFICANT EVENTS/STUDIES: 10/23  admitted 10/24  progressive resp failure, placed on BIPAP. Transferred to ICU  10/25  severe anxiety/ agitation. Desaturated and did not improve w/ BIPAP. Required emergent intubation.  10/27  off pressors 10/31  Sedate, intermittent agitation, fiO2 to 60% 11/01  Intermittent agitation, high dose fentanyl / propofol  11/02  D/C propofol, continued agitation on precedex, versed added 11/03  Cont agitation on max precedex/versed/fentanyl gtt 11/04  Trach, vanc/zosyn started, agitation improved. Lasix TID 11/05 improved resp status on trach. Added methadone, attempting to wean drips. Febrile 101F 11/08 increase ativan 11/09 Increased methadone, added valproate, stop antibiotics, weaning steroid 11/10 trach cuff exchanged. Worsening hypoxemia. Made DNR.  SUBJECTIVE/OVERNIGHT EVENTS:  11/11 Agonal type breathing and progressively worsened hypoxemia over the past 2 days. New fever this morning (off abx).  VITAL SIGNS: Temp:  [98.3 F (36.8 C)-100.4 F (38 C)] 100.4 F (38 C) (11/11 0412) Pulse Rate:  [84-126] 114 (11/11 0600) Resp:  [12-24] 16 (11/11 0600) BP: (91-179)/(47-82) 119/66 mmHg (11/11 0600) SpO2:  [85 %-94 %] 87 % (11/11 0600) FiO2 (%):  [90 %-100 %] 100 % (11/11 0400) Weight:  [54.1 kg (119 lb 4.3 oz)] 54.1 kg (119 lb 4.3 oz) (11/11 0500)  HEMODYNAMICS:    VENTILATOR SETTINGS: Vent Mode:  [-] PCV FiO2 (%):  [90 %-100 %] 100 % Set Rate:  [15 bmp] 15  bmp PEEP:  [10 cmH20] 10 cmH20 Pressure Support:  [8 cmH20] 8 cmH20 Plateau Pressure:  [18 cmH20-19 cmH20] 19 cmH20  INTAKE / OUTPUT:  Intake/Output Summary (Last 24 hours) at June 12, 2014 0753 Last data filed at June 12, 2014 0600  Gross per 24 hour  Intake 2514.29 ml  Output   3175 ml  Net -660.71 ml    PHYSICAL EXAMINATION: General: Thin chronically ill-appearing male, appears older than stated age.  Neuro: Sedated on vent, RASS -1. Eyes open, no purposeful movement, doesn't follow commands.  HEENT: Scattered abrasions on scalp, trach cdi Cardiovascular: RRR, normal s1/s2, no murmur or JVD Lungs: Diminshed air movement bilaterally. Mild expiratory wheezes bilaterally. Abdomen:  Thin, mildly distended, no BS. Musculoskeletal:  Intact. No LE edema. Skin: Ecchymoses UE and LE  LABS: PULMONARY  Recent Labs Lab 08/19/14 1846  PHART 7.359  PCO2ART 63.1*  PO2ART 55.0*  HCO3 35.5*  TCO2 37  O2SAT 85.0    CBC  Recent Labs Lab 08/17/14 0420 08/18/14 0441 08/19/14 0430  HGB 10.4* 10.1* 10.0*  HCT 33.3* 32.6* 32.3*  WBC 13.5* 9.8 10.7*  PLT 230 211 221    COAGULATION No results for input(s): INR in the last 168 hours.  CARDIAC  No results for input(s): TROPONINI in the last 168 hours. No results for input(s): PROBNP in the last 168 hours.   CHEMISTRY  Recent Labs Lab 08/15/14 0500 08/16/14 0456 08/17/14 0420 08/17/14 2000 08/18/14 0441 08/19/14 0430 June 12, 2014 0500  NA 149* 148* 143  --  150* 146 148*  K 4.1 4.7 3.5*  --  4.2 4.3 4.4  CL 102 101 99  --  105 104 104  CO2 38* 37* 36*  --  38* 34* 36*  GLUCOSE 141* 109* 120*  --  124* 117* 114*  BUN 54* 45* 41*  --  51* 54* 49*  CREATININE 0.60 0.57 0.58  --  0.65 0.65 0.54  CALCIUM 8.5 9.0 8.3*  --  8.7 8.5 8.8  MG 1.8  --  1.4* 2.6* 2.3 2.0  --   PHOS 3.7  --  3.4  --  3.8 3.4  --    Estimated Creatinine Clearance: 70.4 mL/min (by C-G formula based on Cr of 0.54).   LIVER  Recent Labs Lab  08/16/14 0500 08-26-2014 0500  AST  --  30  ALT  --  28  ALKPHOS  --  61  BILITOT  --  0.6  PROT  --  6.7  ALBUMIN 2.5* 2.2*    INFECTIOUS  Recent Labs Lab 08/18/14 0441  LATICACIDVEN 0.6    ENDOCRINE CBG (last 3)   Recent Labs  08/20/14 1923 2014/08/26 0012 26-Aug-2014 0414  GLUCAP 127* 94 114*    IMAGING x48h Ct Head Wo Contrast  08/19/2014   CLINICAL DATA:  Acute encephalopathy, history of seizures.  EXAM: CT HEAD WITHOUT CONTRAST  TECHNIQUE: Contiguous axial images were obtained from the base of the skull through the vertex without intravenous contrast.  COMPARISON:  07/23/2014.  FINDINGS: No evidence of an acute infarct, acute hemorrhage, mass lesion, mass effect or hydrocephalus. Atrophy. Patchy and confluent low-attenuation in the periventricular deep white matter. Scattered remote lacunar infarcts in the basal ganglia. Fluid is seen in the left maxillary sinus and right sphenoid sinus. Scattered opacification of the ethmoid air cells with complete opacification of the left sphenoid sinus. Findings are new from 08/06/2014. Bilateral mastoid effusions are new as well.  IMPRESSION: 1. No acute intracranial abnormality. 2. Atrophy, chronic microvascular white matter ischemic changes and remote lacunar infarcts. 3. Paranasal sinus fluid and mastoid effusions are new.   Electronically Signed   By: Leanna Battles M.D.   On: 08/19/2014 13:14   Dg Chest Port 1 View  08-26-2014   CLINICAL DATA:  Ventilator associated pneumonia, respiratory failure, history of COPD and tobacco use  EXAM: PORTABLE CHEST - 1 VIEW  COMPARISON:  Portable chest x-ray of August 20, 2014  FINDINGS: The lungs are adequately inflated. The interstitial markings remain increased diffusely and become nearly confluent at the lung bases. The hemidiaphragms remain obscured. The cardiac silhouette is normal in size. The pulmonary vascularity is not engorged. There is no pneumothorax.  The tracheostomy tube tip lies 3.8  cm above the crotch of the carina. The radiodense tipped feeding tube terminates in the proximal gastric fundus. The right internal jugular venous catheter tip overlies the midportion of the SVC.  IMPRESSION: Stable appearance of the chest with bilateral interstitial pneumonia and/or edema with small bilateral pleural effusions at the lung bases. The support tubes are in position as described. Advancement of the feeding tube is recommended.   Electronically Signed   By: David  [place]   On: 26-Aug-2014 07:45   Dg Chest Port 1 View  08/20/2014   CLINICAL DATA:  Acute respiratory failure  EXAM: PORTABLE CHEST - 1 VIEW  COMPARISON:  08/19/2014  FINDINGS: Tracheostomy tube tip is above the carina. There is a feeding tube with tip in the stomach. Heart size is stable there are bilateral pleural effusions and interstitial edema consistent with CHF. Bilateral airspace opacities may represent superimposed infection or at areas of alveolar  edema. When compared with the previous exam the overall aeration the lungs is unchanged.  IMPRESSION: 1. No change in pleural effusions, interstitial edema and bilateral airspace opacities.   Electronically Signed   By: Signa Kellaylor  Stroud M.D.   On: 08/20/2014 14:33    ASSESSMENT / PLAN:  NEUROLOGIC A:   Acute encephalopathy - failed precedex gtt  Hx seizure disorder, on dilantin at therapeutic goal 11/1 after correction  -16-Dec-2013 off propofol, fentanyl back up to 400mcg  P:   RASS goal: 0 to -2 Fentanyl gtt, wean Methadone 20mg  BID, hold QTc >51800ms Ativan 1.5mg  TID Seroquel 200 TID Valproate 500mg  BID Cont thiamine, folate, MVI Monitor for DT's  PULMONARY OETT 10/25>>> 11/04 Trach 11/04, exchange 11/10 A: Acute hypoxic and hypercarbic respiratory failure PNA, antibiotics stopped AE-COPD - likely severe COPD  -16-Dec-2013 worsening hypoxemia  P:   Wean Fio2 as able Duoneb Q6 with Q3 PRN albuterol  Brovana, pulmicort Steroid taper: Prednisone 15mg   today>10mg >5mg  end 11/13 PSV trials as tolerated  CARDIOVASCULAR CVL 10/25 >> A-line 10/25 >> 10/29 A:  Sepsis - resolved off pressors Echo 08/03/14 EF 55-60%  -16-Dec-2013 stable. On multiple QTc prolonging agents, 12 lead EKG 11/10 QTc 423ms  P:  Tele monitoring PRN EKG with multiple QTc prolonging agents  RENAL A:  Hypokalemia/ hypomag - resolved Hypochloremia - resolved Hypernatremia, improved Contraction alkalosis   -16-Dec-2013 slight increase in hypernatremia with additional lasix  P:   Follow BMP Replete electrolytes as indicated Free h20 started 11/8  GASTROINTESTINAL OGT >> A:   Aspiration risk  Protein calorie malnutrition  -16-Dec-2013 stable.  P:   PPI  Cont TFs @ 55cc/hr, at goal Colace, senna qday  HEMATOLOGIC A:   Mild thrombocytopenia, resolved  -16-Dec-2013 stable.  P:  Trend CBC  Monitor signs of bleeding [place] heparin   INFECTIOUS BCx2 10/23>>>neg resp viral panel 10/24>>>Negative Abx: azithro 10/23>>>10/28 Abx: rocephin 10/23>>> 0/30 Abx: Zosyn 11/04>>>11/10 Abx: Vancomycin 11/04>>>11/10 A:   CAP  -16-Dec-2013 low grade fever. Off abx.  P:   Contact isolation per protocol  ENDOCRINE A:   Stable CBGs - stress and steroids causing some hyperglycemia  P:   SSI at goal < 180 Weaning steroids   FAMILY  Updates: wife & daughter-in-law at bedside 10/29, 10/31 by RB, 08/12/14 & 08/13/14 by MR. 08/14/14 by DF. 08/15/14 by RA , 11/7 per MR. 11/9, 11/10 by BQ  Inter-disciplinary family meet or Palliative Care meeting:  Discussed with family regarding goals of care and long term vent support. Family made him DNR on 11/10.   Tawni CarnesAndrew Wight, MD 09/07/14, 7:53 AM PGY-2, [facility]  Attending:  I have seen and examined the patient with nurse practitioner/resident and agree with the note above.   We have not made any progress and unfortunately his overall condition is terrible today.  He is more encephalopathic, dyspnic and  agitated.  I discussed the situation again at length with his family.  They are comfortable proceeding with comfort measures only because they know that he would not want to live this way.  Plan: Dilaudid gtt post bolus for comfort measures Plan to remove from ventilator when comfortable  CC time by me 35 minutes  Heber CarolinaBrent McQuaid, MD [facility] Pager: [phone] Cell: [phone] If no response, call [phone]

## 2014-09-10 NOTE — Progress Notes (Addendum)
Remaining 15 ML of Dilaudid in drip wasted,  Remaining 150 ml of Fentanyl in drip wsated with Corliss SkainsJuan Claudio RN.  Dan MakerJames Joee Iovine RN

## 2014-09-10 NOTE — Procedures (Signed)
Extubation Procedure Note  Patient Details:   Name: Kenneth Bass DOB: 02/13/1951 MRN: [id number]   Airway Documentation:     Evaluation  O2 sats: transiently fell during during procedure Complications: No apparent complications Patient did tolerate procedure well. Bilateral Breath Sounds: Rhonchi Suctioning: Oral, Airway No   Pt taken off ventilator to room air, per comfort care orders. Trach Collar placed over trach due to increased secretions.   Carolan ShiverKelley, Teyonna Plaisted M 08/25/2014, 2:38 PM

## 2014-09-10 NOTE — Progress Notes (Signed)
Chaplain present minutes after pt passing. Family surrounded bedside, shed tears, and were supporting one another. Chaplain offered prayer and offered more tissues. Page chaplain as needed.    2013-11-12 1500  Clinical Encounter Type  Visited With Patient and family together;Health care provider  Visit Type Spiritual support;Patient actively dying  Referral From Physician  Spiritual Encounters  Spiritual Needs Grief support;Emotional;Prayer  Stress Factors  Family Stress Factors Loss  Kenneth Bass, Mayer MaskerCourtney F, Chaplain 05-21-14 3:16 PM

## 2014-09-10 NOTE — Progress Notes (Signed)
Chaplain responded to spiritual care consult for end of life. Chaplain introduced herself to all family members present. Chaplain offered prayer, read the Bible, and offered emotional support. Chaplain presence appreciated by family. Chaplain will continue to check in with family. Page chaplain as needed.    06-20-2014 1300  Clinical Encounter Type  Visited With Patient and family together;Health care provider  Visit Type Patient actively dying;Spiritual support  Referral From Physician  Consult/Referral To Chaplain  Spiritual Encounters  Spiritual Needs Prayer;Sacred text;Grief support  Stress Factors  Family Stress Factors Loss  Ameenah Prosser, Mayer MaskerCourtney F, Chaplain 2013/12/29 1:41 PM

## 2014-09-10 NOTE — Progress Notes (Signed)
Spoke with Kenneth Bass Medical Examiner patient cleared to go to funeral home per ME.  Kenneth SkainsJuan Claudio RN

## 2014-09-10 NOTE — Progress Notes (Signed)
Patient presenting with agonal breathing.  MD notified of deterioration in respiratory status.  Will continue to monitor.

## 2014-09-10 DEATH — deceased

## 2016-06-08 IMAGING — CT CT HEAD W/O CM
1 series · 16 of 30 positions shown, 20 images · non-contrast
Comparison: 08/02/2014.

CLINICAL DATA: Acute encephalopathy, history of seizures.

EXAM:
CT HEAD WITHOUT CONTRAST
TECHNIQUE: Contiguous axial images were obtained from the base of the skull
through the vertex without intravenous contrast.

[Series 2: head 5.0 h30s · axial · 0.51mm/px · z∈[-97,+53]mm · 16 of 34 slices shown, 20 images]
[im 2/34  brain]
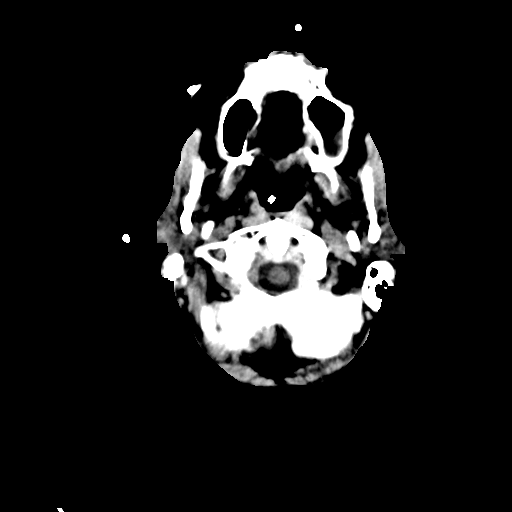
[im 2/34  bone]
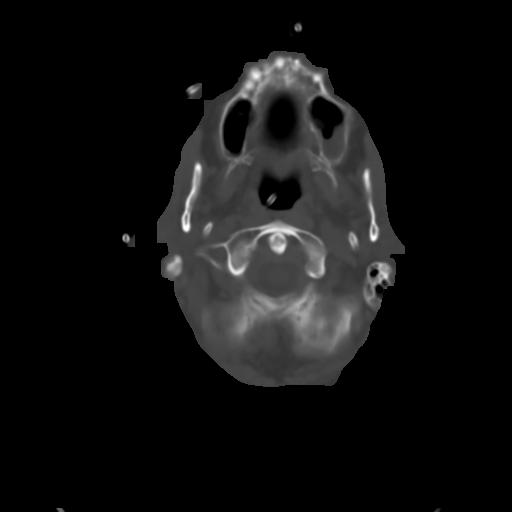
[im 4/34  brain]
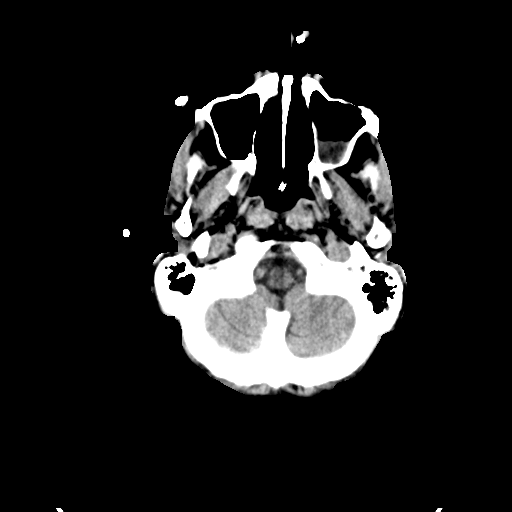
[im 6/34  brain]
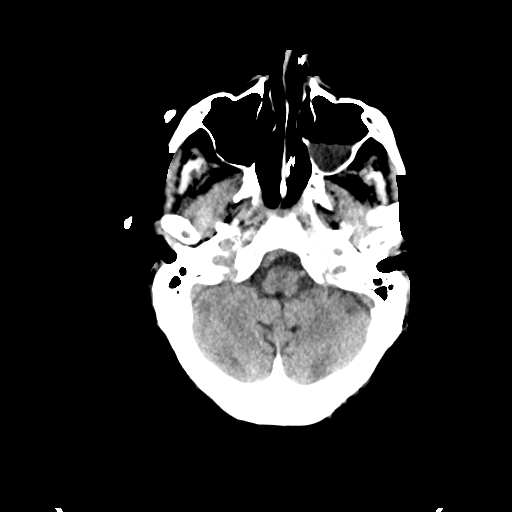
[im 8/34  brain]
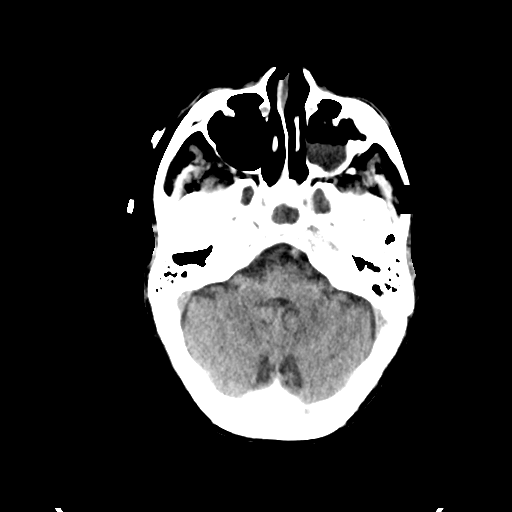
[im 10/34  brain]
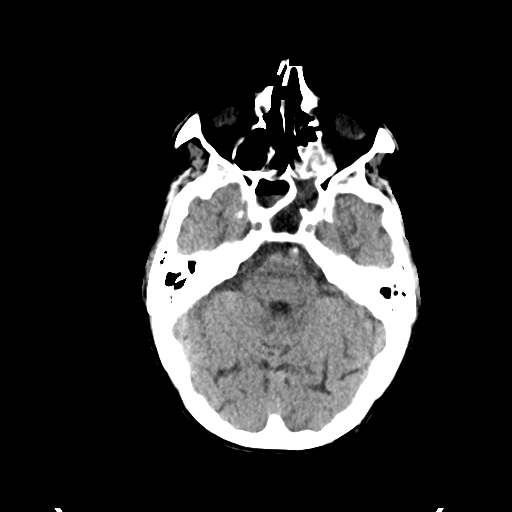
[im 10/34  bone]
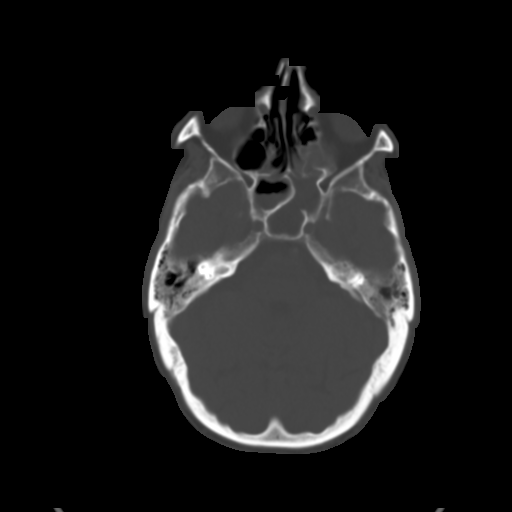
[im 12/34  brain]
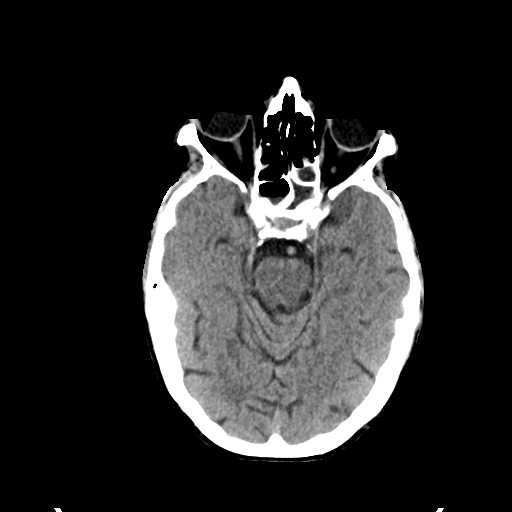
[im 14/34  brain]
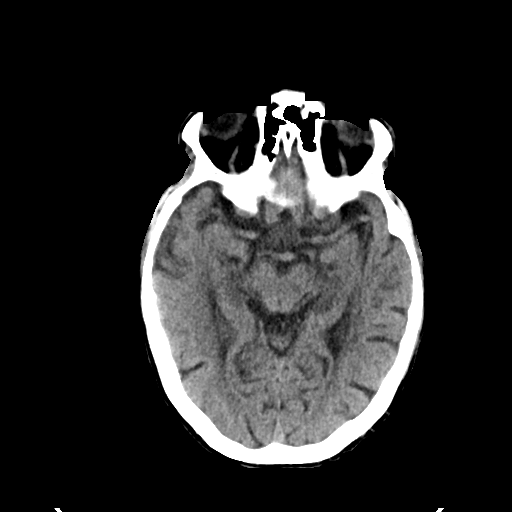
[im 16/34  brain]
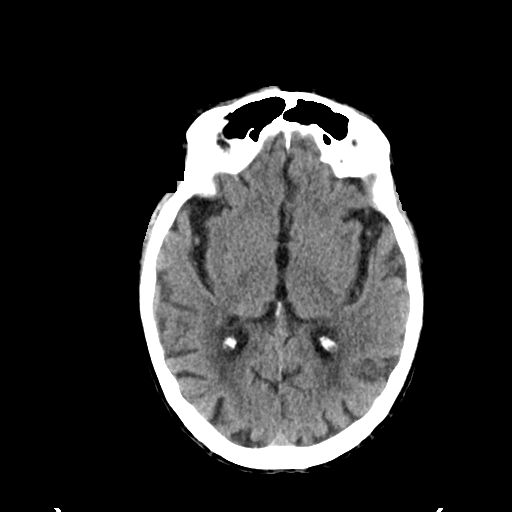
[im 18/34  brain]
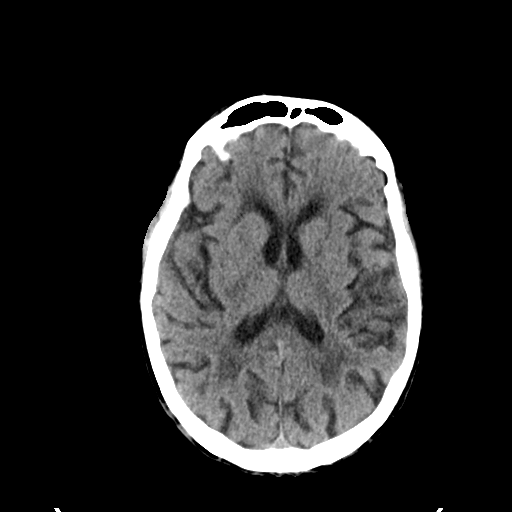
[im 18/34  bone]
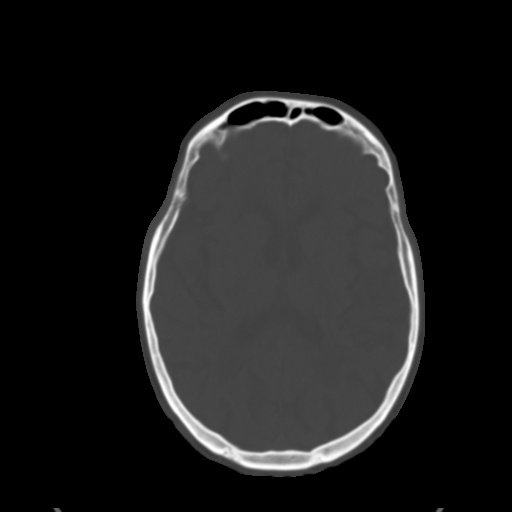
[im 20/34  brain]
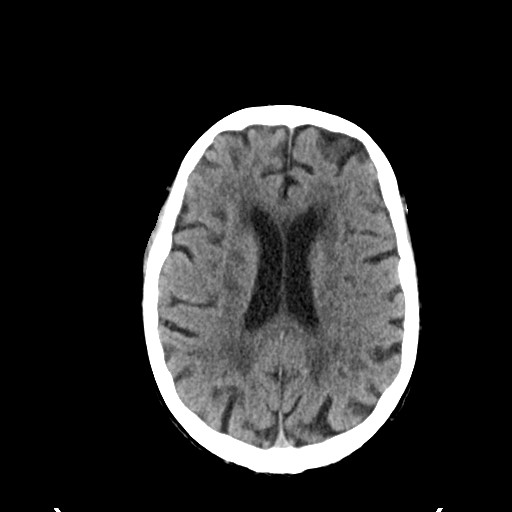
[im 22/34  brain]
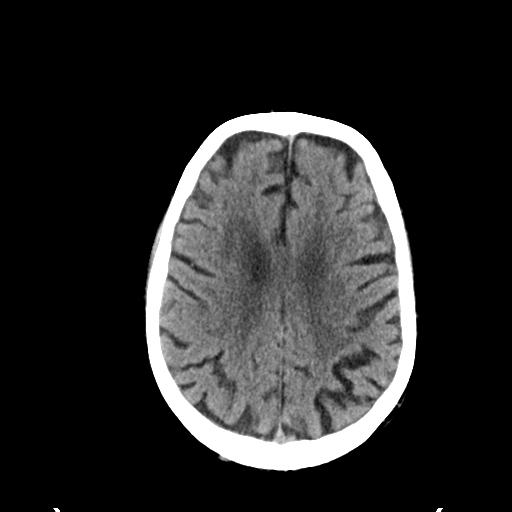
[im 24/34  brain]
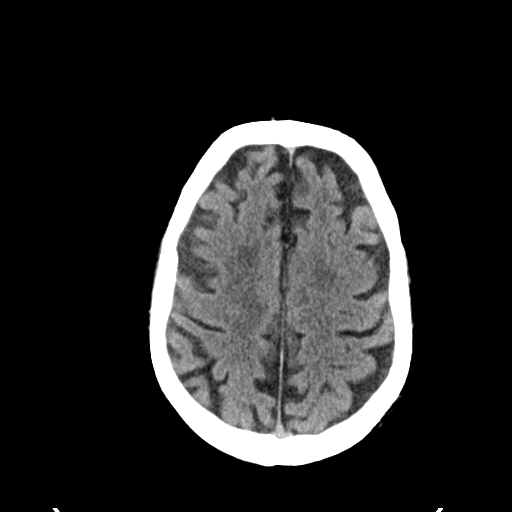
[im 26/34  brain]
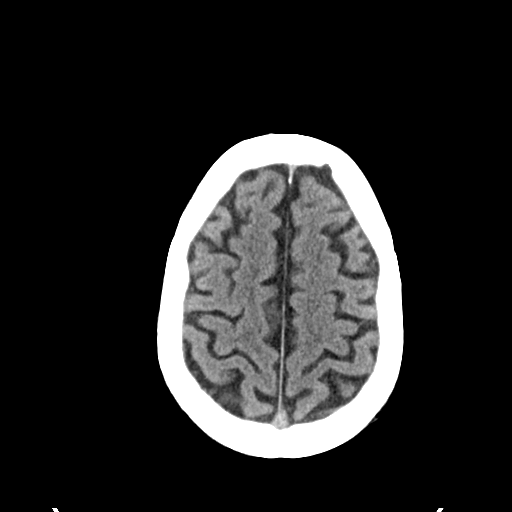
[im 26/34  bone]
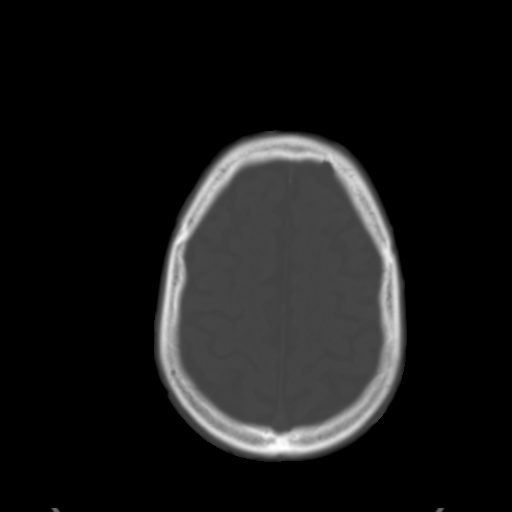
[im 28/34  brain]
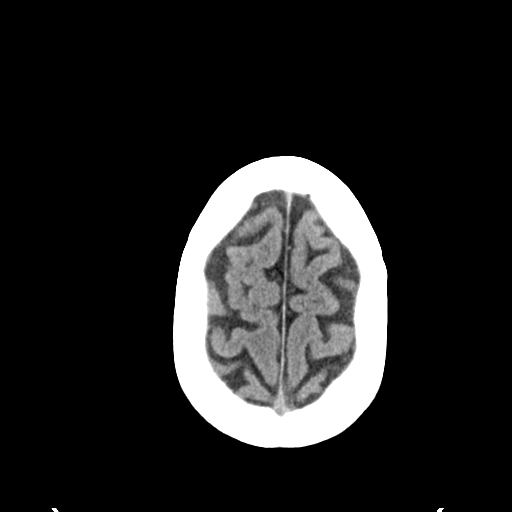
[im 30/34  brain]
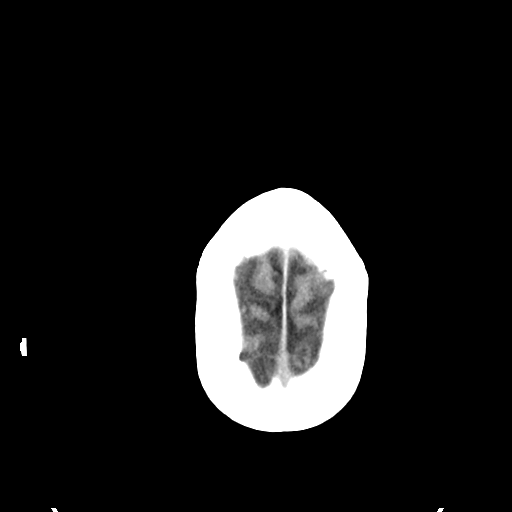
[im 32/34  brain]
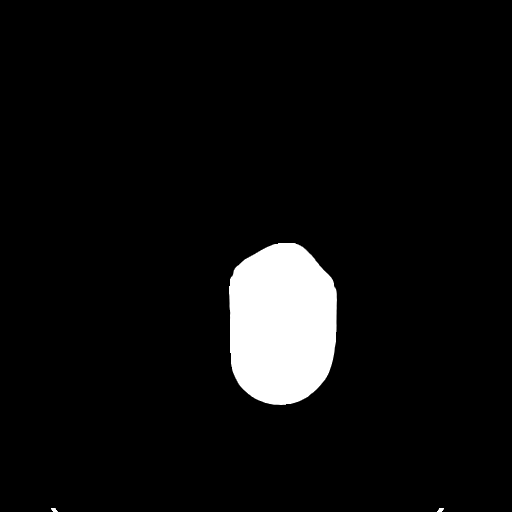

[16 of 30 positions shown; findings below may reference images not displayed]

FINDINGS: No evidence of an acute infarct, acute hemorrhage, mass lesion, mass
effect or hydrocephalus. Atrophy. Patchy and confluent
low-attenuation in the periventricular deep white matter. Scattered
remote lacunar infarcts in the basal ganglia. Fluid is seen in the
left maxillary sinus and right sphenoid sinus. Scattered
opacification of the ethmoid air cells with complete opacification
of the left sphenoid sinus. Findings are new from 08/02/2014.
Bilateral mastoid effusions are new as well.
IMPRESSION: 1. No acute intracranial abnormality.
2. Atrophy, chronic microvascular white matter ischemic changes and
remote lacunar infarcts.
3. Paranasal sinus fluid and mastoid effusions are new.

## 2016-06-10 IMAGING — CR DG CHEST 1V PORT
2 series · 2 of 2 positions shown · non-contrast
Comparison: Portable chest x-ray August 20, 2014

CLINICAL DATA: Ventilator associated pneumonia, respiratory
failure, history of COPD and tobacco use

EXAM:
PORTABLE CHEST - 1 VIEW

[AP (1 of 2)]
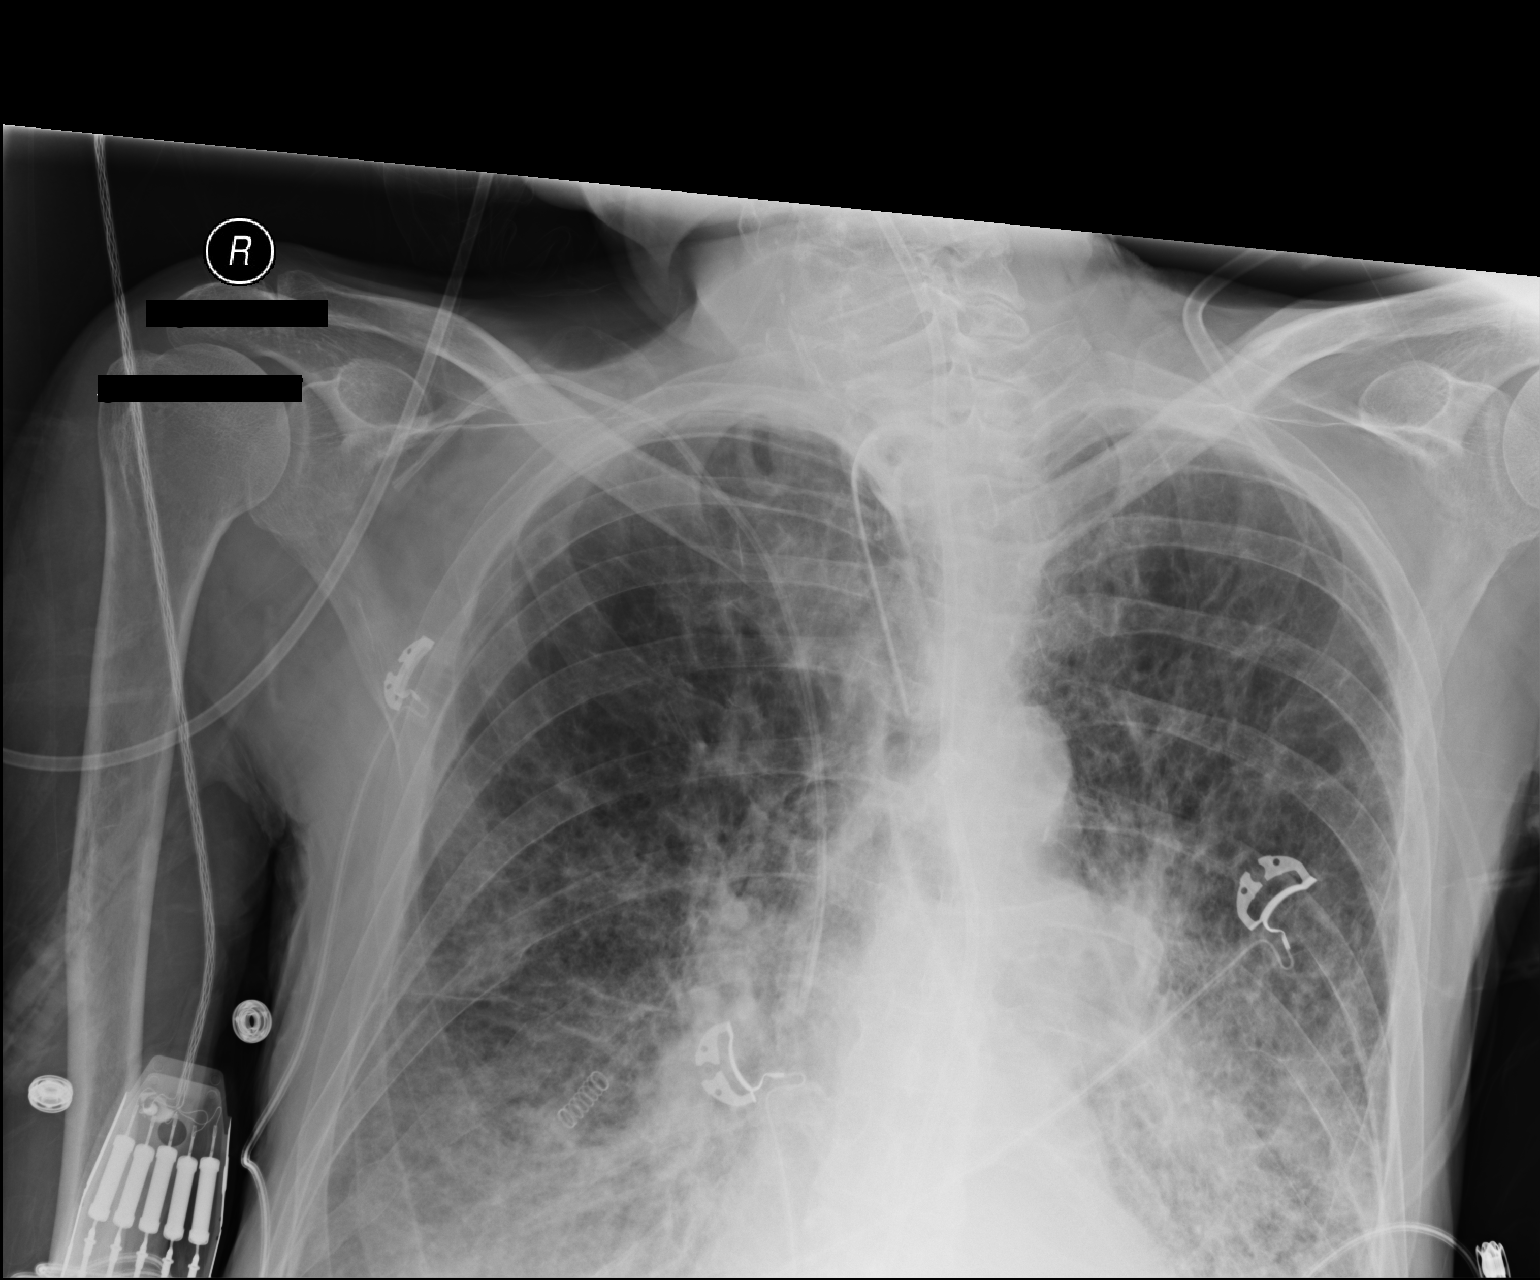

[AP (2 of 2)]
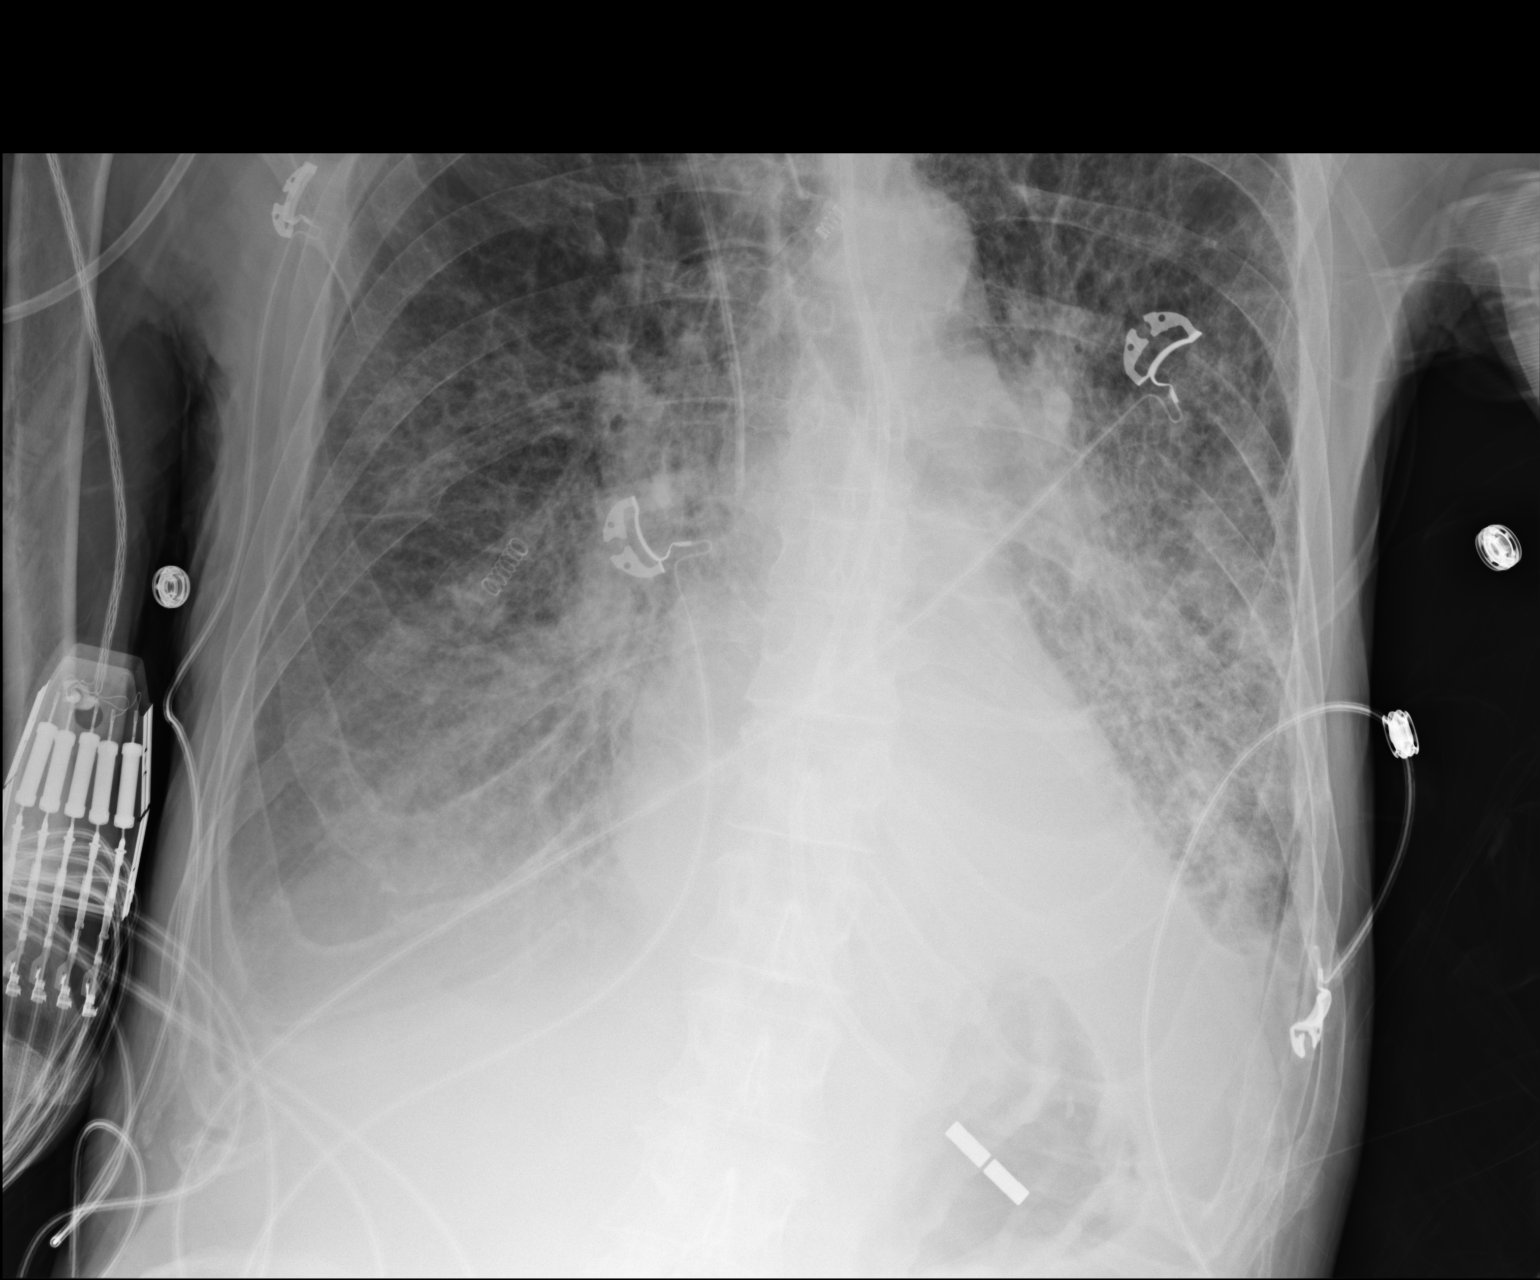

[2 of 2 positions shown; findings below may reference images not displayed]

FINDINGS: The lungs are adequately inflated. The interstitial markings remain
increased diffusely and become nearly confluent at the lung bases.
The hemidiaphragms remain obscured. The cardiac silhouette is normal
in size. The pulmonary vascularity is not engorged. There is no
pneumothorax.

The tracheostomy tube tip lies 3.8 cm above the crotch of the
carina. The radiodense tipped feeding tube terminates in the
proximal gastric fundus. The right internal jugular venous catheter
tip overlies the midportion of the SVC.
IMPRESSION: Stable appearance of the chest with bilateral interstitial pneumonia
and/or edema with small bilateral pleural effusions at the lung
bases. The support tubes are in position as described. Advancement
of the feeding tube is recommended.
